# Patient Record
Sex: Female | Born: 1935 | Race: White | Hispanic: No | Smoking: Former smoker
Health system: Southern US, Community
[De-identification: ages and names within clinical notes are randomized; demographics above are authoritative.]

## PROBLEM LIST (undated history)

## (undated) DIAGNOSIS — I10 Essential (primary) hypertension: Secondary | ICD-10-CM

## (undated) DIAGNOSIS — H409 Unspecified glaucoma: Secondary | ICD-10-CM

## (undated) DIAGNOSIS — Z87891 Personal history of nicotine dependence: Secondary | ICD-10-CM

## (undated) DIAGNOSIS — Z1211 Encounter for screening for malignant neoplasm of colon: Secondary | ICD-10-CM

## (undated) DIAGNOSIS — E039 Hypothyroidism, unspecified: Secondary | ICD-10-CM

## (undated) DIAGNOSIS — E785 Hyperlipidemia, unspecified: Secondary | ICD-10-CM

## (undated) DIAGNOSIS — I509 Heart failure, unspecified: Secondary | ICD-10-CM

## (undated) DIAGNOSIS — G309 Alzheimer's disease, unspecified: Secondary | ICD-10-CM

## (undated) DIAGNOSIS — F329 Major depressive disorder, single episode, unspecified: Secondary | ICD-10-CM

## (undated) DIAGNOSIS — Z1239 Encounter for other screening for malignant neoplasm of breast: Secondary | ICD-10-CM

## (undated) DIAGNOSIS — I4891 Unspecified atrial fibrillation: Secondary | ICD-10-CM

## (undated) DIAGNOSIS — I6529 Occlusion and stenosis of unspecified carotid artery: Secondary | ICD-10-CM

## (undated) DIAGNOSIS — F32A Depression, unspecified: Secondary | ICD-10-CM

## (undated) DIAGNOSIS — F0280 Dementia in other diseases classified elsewhere without behavioral disturbance: Secondary | ICD-10-CM

## (undated) HISTORY — DX: Personal history of nicotine dependence: Z87.891

## (undated) HISTORY — DX: Unspecified glaucoma: H40.9

## (undated) HISTORY — DX: Hyperlipidemia, unspecified: E78.5

## (undated) HISTORY — PX: BACK SURGERY: SHX140

## (undated) HISTORY — DX: Essential (primary) hypertension: I10

## (undated) HISTORY — DX: Encounter for other screening for malignant neoplasm of breast: Z12.39

## (undated) HISTORY — DX: Encounter for screening for malignant neoplasm of colon: Z12.11

## (undated) HISTORY — DX: Occlusion and stenosis of unspecified carotid artery: I65.29

---
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
## 2004-12-09 HISTORY — PX: COLONOSCOPY: SHX174

## 2004-12-20 ENCOUNTER — Ambulatory Visit: Payer: Self-pay | Admitting: Internal Medicine

## 2006-02-12 DIAGNOSIS — I10 Essential (primary) hypertension: Secondary | ICD-10-CM | POA: Insufficient documentation

## 2006-02-12 DIAGNOSIS — M159 Polyosteoarthritis, unspecified: Secondary | ICD-10-CM | POA: Insufficient documentation

## 2006-02-12 DIAGNOSIS — K5730 Diverticulosis of large intestine without perforation or abscess without bleeding: Secondary | ICD-10-CM | POA: Insufficient documentation

## 2006-02-12 DIAGNOSIS — J309 Allergic rhinitis, unspecified: Secondary | ICD-10-CM | POA: Insufficient documentation

## 2006-02-12 DIAGNOSIS — H4010X0 Unspecified open-angle glaucoma, stage unspecified: Secondary | ICD-10-CM | POA: Insufficient documentation

## 2006-02-28 ENCOUNTER — Ambulatory Visit: Payer: Self-pay | Admitting: Family Medicine

## 2006-09-08 DIAGNOSIS — E039 Hypothyroidism, unspecified: Secondary | ICD-10-CM | POA: Insufficient documentation

## 2007-04-15 ENCOUNTER — Ambulatory Visit: Payer: Self-pay | Admitting: Family Medicine

## 2007-05-14 DIAGNOSIS — K8020 Calculus of gallbladder without cholecystitis without obstruction: Secondary | ICD-10-CM

## 2008-04-19 ENCOUNTER — Ambulatory Visit: Payer: Self-pay | Admitting: Family Medicine

## 2008-04-20 ENCOUNTER — Ambulatory Visit: Payer: Self-pay | Admitting: Family Medicine

## 2008-07-21 ENCOUNTER — Ambulatory Visit: Payer: Self-pay | Admitting: Family Medicine

## 2008-10-19 ENCOUNTER — Ambulatory Visit: Payer: Self-pay | Admitting: Family Medicine

## 2008-12-09 HISTORY — PX: CAROTID ENDARTERECTOMY: SUR193

## 2008-12-27 ENCOUNTER — Ambulatory Visit: Payer: Self-pay | Admitting: Family Medicine

## 2009-01-18 ENCOUNTER — Ambulatory Visit: Payer: Self-pay | Admitting: General Surgery

## 2009-02-08 ENCOUNTER — Ambulatory Visit: Payer: Self-pay | Admitting: General Surgery

## 2009-02-15 ENCOUNTER — Inpatient Hospital Stay: Payer: Self-pay | Admitting: General Surgery

## 2009-10-23 ENCOUNTER — Ambulatory Visit: Payer: Self-pay | Admitting: Family Medicine

## 2009-11-01 ENCOUNTER — Ambulatory Visit: Payer: Self-pay | Admitting: Ophthalmology

## 2009-11-15 ENCOUNTER — Ambulatory Visit: Payer: Self-pay | Admitting: Ophthalmology

## 2010-10-30 ENCOUNTER — Ambulatory Visit: Payer: Self-pay | Admitting: Family Medicine

## 2010-11-02 ENCOUNTER — Other Ambulatory Visit: Payer: Self-pay | Admitting: Family Medicine

## 2011-07-31 ENCOUNTER — Ambulatory Visit: Payer: Self-pay | Admitting: Ophthalmology

## 2013-01-12 ENCOUNTER — Ambulatory Visit: Payer: Self-pay | Admitting: Neurology

## 2013-06-07 ENCOUNTER — Encounter: Payer: Self-pay | Admitting: *Deleted

## 2013-06-07 DIAGNOSIS — I6529 Occlusion and stenosis of unspecified carotid artery: Secondary | ICD-10-CM | POA: Insufficient documentation

## 2013-07-22 ENCOUNTER — Ambulatory Visit: Payer: Self-pay | Admitting: Family Medicine

## 2013-07-29 ENCOUNTER — Ambulatory Visit: Payer: Self-pay | Admitting: Family Medicine

## 2013-11-25 ENCOUNTER — Ambulatory Visit: Payer: Self-pay | Admitting: Family Medicine

## 2013-12-15 ENCOUNTER — Ambulatory Visit: Payer: Self-pay | Admitting: General Surgery

## 2013-12-22 ENCOUNTER — Emergency Department: Payer: Self-pay | Admitting: Emergency Medicine

## 2013-12-22 LAB — BASIC METABOLIC PANEL
ANION GAP: 4 — AB (ref 7–16)
BUN: 15 mg/dL (ref 7–18)
CREATININE: 0.85 mg/dL (ref 0.60–1.30)
Calcium, Total: 8.3 mg/dL — ABNORMAL LOW (ref 8.5–10.1)
Chloride: 100 mmol/L (ref 98–107)
Co2: 28 mmol/L (ref 21–32)
Glucose: 79 mg/dL (ref 65–99)
Osmolality: 264 (ref 275–301)
Potassium: 3.8 mmol/L (ref 3.5–5.1)
Sodium: 132 mmol/L — ABNORMAL LOW (ref 136–145)

## 2013-12-22 LAB — URINALYSIS, COMPLETE
Bilirubin,UR: NEGATIVE
Glucose,UR: NEGATIVE mg/dL (ref 0–75)
NITRITE: NEGATIVE
Ph: 5 (ref 4.5–8.0)
Protein: NEGATIVE
RBC,UR: 3 /HPF (ref 0–5)
SPECIFIC GRAVITY: 1.019 (ref 1.003–1.030)
WBC UR: 29 /HPF (ref 0–5)

## 2013-12-22 LAB — CBC WITH DIFFERENTIAL/PLATELET
Basophil #: 0.1 10*3/uL (ref 0.0–0.1)
Basophil %: 1.4 %
EOS ABS: 0.2 10*3/uL (ref 0.0–0.7)
Eosinophil %: 2.7 %
HCT: 40 % (ref 35.0–47.0)
HGB: 13.7 g/dL (ref 12.0–16.0)
LYMPHS ABS: 2.4 10*3/uL (ref 1.0–3.6)
Lymphocyte %: 34.5 %
MCH: 33.7 pg (ref 26.0–34.0)
MCHC: 34.2 g/dL (ref 32.0–36.0)
MCV: 98 fL (ref 80–100)
MONO ABS: 0.5 x10 3/mm (ref 0.2–0.9)
MONOS PCT: 6.9 %
Neutrophil #: 3.7 10*3/uL (ref 1.4–6.5)
Neutrophil %: 54.5 %
Platelet: 226 10*3/uL (ref 150–440)
RBC: 4.06 10*6/uL (ref 3.80–5.20)
RDW: 13.5 % (ref 11.5–14.5)
WBC: 6.9 10*3/uL (ref 3.6–11.0)

## 2013-12-22 LAB — TROPONIN I

## 2013-12-26 LAB — URINE CULTURE

## 2014-01-12 ENCOUNTER — Encounter: Payer: Self-pay | Admitting: *Deleted

## 2014-04-03 ENCOUNTER — Emergency Department: Payer: Self-pay

## 2014-04-03 LAB — CBC WITH DIFFERENTIAL/PLATELET
BASOS ABS: 0.1 10*3/uL (ref 0.0–0.1)
BASOS PCT: 1 %
Eosinophil #: 0.2 10*3/uL (ref 0.0–0.7)
Eosinophil %: 1.9 %
HCT: 36.5 % (ref 35.0–47.0)
HGB: 12.1 g/dL (ref 12.0–16.0)
LYMPHS ABS: 2.1 10*3/uL (ref 1.0–3.6)
LYMPHS PCT: 25.2 %
MCH: 34.4 pg — ABNORMAL HIGH (ref 26.0–34.0)
MCHC: 33.2 g/dL (ref 32.0–36.0)
MCV: 104 fL — AB (ref 80–100)
MONO ABS: 0.5 x10 3/mm (ref 0.2–0.9)
MONOS PCT: 6.5 %
Neutrophil #: 5.5 10*3/uL (ref 1.4–6.5)
Neutrophil %: 65.4 %
Platelet: 256 10*3/uL (ref 150–440)
RBC: 3.53 10*6/uL — ABNORMAL LOW (ref 3.80–5.20)
RDW: 15.6 % — AB (ref 11.5–14.5)
WBC: 8.5 10*3/uL (ref 3.6–11.0)

## 2014-04-03 LAB — BASIC METABOLIC PANEL
Anion Gap: 5 — ABNORMAL LOW (ref 7–16)
BUN: 15 mg/dL (ref 7–18)
CALCIUM: 8.5 mg/dL (ref 8.5–10.1)
CHLORIDE: 101 mmol/L (ref 98–107)
CO2: 30 mmol/L (ref 21–32)
Creatinine: 0.86 mg/dL (ref 0.60–1.30)
EGFR (Non-African Amer.): >60
Glucose: 95 mg/dL (ref 65–99)
Osmolality: 273 (ref 275–301)
Potassium: 4 mmol/L (ref 3.5–5.1)
Sodium: 136 mmol/L (ref 136–145)

## 2014-04-29 ENCOUNTER — Emergency Department: Payer: Self-pay | Admitting: Emergency Medicine

## 2014-04-29 LAB — BASIC METABOLIC PANEL
ANION GAP: 6 — AB (ref 7–16)
BUN: 15 mg/dL (ref 7–18)
CHLORIDE: 105 mmol/L (ref 98–107)
Calcium, Total: 8.9 mg/dL (ref 8.5–10.1)
Co2: 25 mmol/L (ref 21–32)
Creatinine: 0.86 mg/dL (ref 0.60–1.30)
EGFR (Non-African Amer.): >60
Glucose: 119 mg/dL — ABNORMAL HIGH (ref 65–99)
Osmolality: 274 (ref 275–301)
Potassium: 4 mmol/L (ref 3.5–5.1)
Sodium: 136 mmol/L (ref 136–145)

## 2014-04-29 LAB — CBC WITH DIFFERENTIAL/PLATELET
Basophil #: 0.1 10*3/uL (ref 0.0–0.1)
Basophil %: 1.2 %
Eosinophil #: 0.2 10*3/uL (ref 0.0–0.7)
Eosinophil %: 3.1 %
HCT: 39.4 % (ref 35.0–47.0)
HGB: 12.6 g/dL (ref 12.0–16.0)
LYMPHS ABS: 2.1 10*3/uL (ref 1.0–3.6)
LYMPHS PCT: 33 %
MCH: 33.4 pg (ref 26.0–34.0)
MCHC: 32.1 g/dL (ref 32.0–36.0)
MCV: 104 fL — ABNORMAL HIGH (ref 80–100)
Monocyte #: 0.5 x10 3/mm (ref 0.2–0.9)
Monocyte %: 7.6 %
NEUTROS ABS: 3.5 10*3/uL (ref 1.4–6.5)
NEUTROS PCT: 55.1 %
Platelet: 237 10*3/uL (ref 150–440)
RBC: 3.77 10*6/uL — AB (ref 3.80–5.20)
RDW: 14.3 % (ref 11.5–14.5)
WBC: 6.3 10*3/uL (ref 3.6–11.0)

## 2014-05-06 ENCOUNTER — Emergency Department: Payer: Self-pay | Admitting: Emergency Medicine

## 2015-07-19 ENCOUNTER — Other Ambulatory Visit
Admission: RE | Admit: 2015-07-19 | Discharge: 2015-07-19 | Disposition: A | Discharge status: Home / Self Care | Payer: Medicare Other | Source: Ambulatory Visit | Attending: Nurse Practitioner | Admitting: Nurse Practitioner

## 2015-07-19 DIAGNOSIS — R410 Disorientation, unspecified: Secondary | ICD-10-CM | POA: Diagnosis present

## 2015-07-19 LAB — URINALYSIS COMPLETE WITH MICROSCOPIC (ARMC ONLY)
Glucose, UA: NEGATIVE mg/dL
NITRITE: NEGATIVE
PROTEIN: 30 mg/dL — AB
Specific Gravity, Urine: 1.02 (ref 1.005–1.030)
pH: 6 (ref 5.0–8.0)

## 2015-08-10 ENCOUNTER — Other Ambulatory Visit
Admission: RE | Admit: 2015-08-10 | Discharge: 2015-08-10 | Disposition: A | Discharge status: Home / Self Care | Payer: Medicare Other | Source: Ambulatory Visit | Attending: Nurse Practitioner | Admitting: Nurse Practitioner

## 2015-08-10 DIAGNOSIS — R410 Disorientation, unspecified: Secondary | ICD-10-CM | POA: Insufficient documentation

## 2015-08-10 LAB — URINALYSIS COMPLETE WITH MICROSCOPIC (ARMC ONLY)
Bilirubin Urine: NEGATIVE
Glucose, UA: NEGATIVE mg/dL
Nitrite: POSITIVE — AB
Protein, ur: 100 mg/dL — AB
Specific Gravity, Urine: 1.025 (ref 1.005–1.030)
pH: 5.5 (ref 5.0–8.0)

## 2015-08-13 LAB — URINE CULTURE: Culture: >=100000

## 2015-09-13 ENCOUNTER — Other Ambulatory Visit
Admission: RE | Admit: 2015-09-13 | Discharge: 2015-09-13 | Disposition: A | Discharge status: Home / Self Care | Payer: Medicare Other | Source: Ambulatory Visit | Attending: Nurse Practitioner | Admitting: Nurse Practitioner

## 2015-09-13 DIAGNOSIS — R410 Disorientation, unspecified: Secondary | ICD-10-CM | POA: Insufficient documentation

## 2015-09-13 LAB — URINALYSIS COMPLETE WITH MICROSCOPIC (ARMC ONLY)
Bilirubin Urine: NEGATIVE
Glucose, UA: NEGATIVE mg/dL
Nitrite: POSITIVE — AB
PH: 5.5 (ref 5.0–8.0)
PROTEIN: NEGATIVE mg/dL
Specific Gravity, Urine: 1.02 (ref 1.005–1.030)

## 2015-09-16 LAB — URINE CULTURE

## 2015-10-14 ENCOUNTER — Other Ambulatory Visit
Admission: RE | Admit: 2015-10-14 | Discharge: 2015-10-14 | Disposition: A | Discharge status: Home / Self Care | Payer: Medicare Other | Source: Ambulatory Visit | Attending: Nurse Practitioner | Admitting: Nurse Practitioner

## 2015-10-14 DIAGNOSIS — N390 Urinary tract infection, site not specified: Secondary | ICD-10-CM | POA: Diagnosis present

## 2015-10-14 LAB — URINALYSIS COMPLETE WITH MICROSCOPIC (ARMC ONLY)
Bilirubin Urine: NEGATIVE
GLUCOSE, UA: NEGATIVE mg/dL
Ketones, ur: NEGATIVE mg/dL
NITRITE: POSITIVE — AB
PROTEIN: NEGATIVE mg/dL
SPECIFIC GRAVITY, URINE: 1.015 (ref 1.005–1.030)
pH: 5 (ref 5.0–8.0)

## 2015-10-17 LAB — URINE CULTURE: Culture: >=100000

## 2015-11-25 ENCOUNTER — Ambulatory Visit
Admission: EM | Admit: 2015-11-25 | Discharge: 2015-11-25 | Disposition: A | Discharge status: Home / Self Care | Payer: Medicare Other | Attending: Internal Medicine | Admitting: Internal Medicine

## 2015-11-25 DIAGNOSIS — R21 Rash and other nonspecific skin eruption: Secondary | ICD-10-CM | POA: Diagnosis not present

## 2015-11-25 HISTORY — DX: Major depressive disorder, single episode, unspecified: F32.9

## 2015-11-25 HISTORY — DX: Alzheimer's disease, unspecified: G30.9

## 2015-11-25 HISTORY — DX: Depression, unspecified: F32.A

## 2015-11-25 HISTORY — DX: Hypothyroidism, unspecified: E03.9

## 2015-11-25 HISTORY — DX: Dementia in other diseases classified elsewhere, unspecified severity, without behavioral disturbance, psychotic disturbance, mood disturbance, and anxiety: F02.80

## 2015-11-25 HISTORY — DX: Unspecified atrial fibrillation: I48.91

## 2015-11-25 LAB — URINALYSIS COMPLETE WITH MICROSCOPIC (ARMC ONLY)
Bilirubin Urine: NEGATIVE
Glucose, UA: NEGATIVE mg/dL
Leukocytes, UA: NEGATIVE
Nitrite: POSITIVE — AB
SPECIFIC GRAVITY, URINE: 1.025 (ref 1.005–1.030)
pH: 5 (ref 5.0–8.0)

## 2015-11-25 NOTE — Discharge Instructions (Signed)
Urine culture pending; risk of treatment of a UTI would have to be weighed against the benefit. Perineal dermatitis: chapped skin would benefit from heavy application of barrier cream (already on med list) at least daily. No wipes for cleaning unless absolutely necessary after a bowel movement.  Soaps/wipes are often too irritating for bottoms. Check on med list; previous med lists show a thyroid product which is not currently on med list. Recheck as needed.

## 2015-11-25 NOTE — ED Provider Notes (Signed)
CSN: [id number]     Arrival date & time 11/25/15  1536 History   First MD Initiated Contact with Patient 11/25/15 1606     Chief Complaint  Patient presents with  . Urinary Frequency    Daughter reports incontinence and thinks she has a UTI. Has been falling more than usual. Daughter reports her urine smells strong. Hx of frequent UTIs and lives a [facility] (lives in a locked unit).    HPI Patient is a 81 year old lady with dementia and frequent UTIs. Often, culture evidence of UTI is not associated with severe symptoms, and PCP is beginning to express concern about automatic treatment, due to risk of multiply resistant organisms. Patient's daughter has observed some odor to the urine, and some evidence that her mother is not feeling well, and would like an assessment. No fever. Some tendency to fall, although none reported today. Appetite is fine.  Past Medical History  Diagnosis Date  . Glaucoma   . Hypertension   . Personal history of tobacco use, presenting hazards to health   . Carotid stenosis   . Breast screening, unspecified   . Special screening for malignant neoplasms, colon   . Hyperlipidemia   . Alzheimer disease   . Hypothyroidism   . Depression   . Atrial fibrillation [facility](HCC)    Past Surgical History  Procedure Laterality Date  . Colonoscopy  2006    [place]  . Carotid endarterectomy Left 2010  . Appendectomy  1940  . Back surgery  [id number]  . Tonsillectomy  1940   History reviewed. No pertinent family history. Social History  Substance Use Topics  . Smoking status: Former Smoker -- 1.00 packs/day for 20 years    Types: Cigarettes  . Smokeless tobacco: None  . Alcohol Use: 3.5 oz/week    7 Standard drinks or equivalent per week     Comment: vodka, 1 drink daily    Review of Systems  All other systems reviewed and are negative.   Allergies  Penicillins  Home Medications   Prior to Admission medications   Medication Sig Start Date End Date Taking?  Authorizing Provider  acetaminophen (TYLENOL) 500 MG tablet Take 1,000 mg by mouth every 6 (six) hours as needed.   Yes Historical Provider, MD  aspirin 325 MG tablet Take 325 mg by mouth daily.   Yes Historical Provider, MD  escitalopram (LEXAPRO) 20 MG tablet Take 20 mg by mouth daily.   Yes Historical Provider, MD  metoprolol tartrate (LOPRESSOR) 25 MG tablet Take 25 mg by mouth 2 (two) times daily.   Yes Historical Provider, MD  pravastatin (PRAVACHOL) 20 MG tablet Take 20 mg by mouth daily.   Yes Historical Provider, MD  pseudoephedrine-guaifenesin (MUCINEX D) 60-600 MG 12 hr tablet Take 1 tablet by mouth every 12 (twelve) hours.   Yes Historical Provider, MD  Skin Protectants, Misc. (DIMETHICONE-ZINC OXIDE) cream Apply topically 2 (two) times daily as needed (to peri area).   Yes Historical Provider, MD    BP 117/67 mmHg  Pulse 74  Temp(Src) 97.5 F (36.4 C) (Oral)  Resp 16  Ht 5\' 1"  (1.549 m)  Wt 141 lb 8 oz (64.184 kg)  BMI 26.75 kg/m2  SpO2 96%  orthostatic vital signs obtained by nursing staff are unremarkable; recorded elsewhere in the chart  Physical Exam  Constitutional: No distress.  Alert, nicely groomed Sitting on her walker  HENT:  Head: Atraumatic.  Eyes:  Conjugate gaze, no eye redness/drainage  Neck: Neck supple.  Cardiovascular: Normal rate.   Irregularly irregular rhythm  Pulmonary/Chest: No respiratory distress.  Lungs clear, symmetric breath sounds Occasional faint expiratory wheeze  Abdominal: Soft. She exhibits no distension. There is no tenderness. There is no guarding.  Musculoskeletal: Normal range of motion.  Trace bilateral pitting edema, not tense  Neurological: She is alert.  Pleasant, conversational  Skin: Skin is warm and dry.  No cyanosis  Nursing note and vitals reviewed.  Nurse reported heavily excoriated perineal skin was observed during the in and out catheterization for urine sample.  ED Course  Procedures (including critical  care time)  Labs Review  Results for orders placed or performed during the hospital encounter of 11/25/15  Urinalysis complete, with microscopic  Result Value Ref Range   Color, Urine YELLOW YELLOW   APPearance CLEAR CLEAR   Glucose, UA NEGATIVE NEGATIVE mg/dL   Bilirubin Urine NEGATIVE NEGATIVE   Ketones, ur TRACE (A) NEGATIVE mg/dL   Specific Gravity, Urine 1.025 1.005 - 1.030   Hgb urine dipstick 2+ (A) NEGATIVE   pH 5.0 5.0 - 8.0   Protein, ur TRACE (A) NEGATIVE mg/dL   Nitrite POSITIVE (A) NEGATIVE   Leukocytes, UA NEGATIVE NEGATIVE   RBC / HPF 0-5 0 - 5 RBC/hpf   WBC, UA 6-30 0 - 5 WBC/hpf   Bacteria, UA MANY (A) NONE SEEN   Squamous Epithelial / LPF 0-5 (A) NONE SEEN   Urine culture pending  MDM   1. Perineal rash in female    Thick layer of barrier ointment at least daily (already prescribed) would be helpful. Would avoid the use of wipes, except after bowel movement, and then use wipes or cleansers only sparingly on inflamed skin.  Treatment of any positive urine culture results would need to be considered carefully, risks versus benefits. Risks would be acquisition of multiply resistant organism, benefit would be more likely if the patient were more severely ill/symptomatic.    Eustace Moore, MD 11/25/15 [phone]

## 2015-11-27 LAB — URINE CULTURE
Culture: >=100000
SPECIAL REQUESTS: NORMAL

## 2015-11-28 ENCOUNTER — Other Ambulatory Visit
Admission: RE | Admit: 2015-11-28 | Discharge: 2015-11-28 | Disposition: A | Discharge status: Home / Self Care | Payer: Medicare Other | Source: Ambulatory Visit | Attending: Nurse Practitioner | Admitting: Nurse Practitioner

## 2015-11-28 DIAGNOSIS — D649 Anemia, unspecified: Secondary | ICD-10-CM | POA: Diagnosis not present

## 2015-11-28 DIAGNOSIS — E039 Hypothyroidism, unspecified: Secondary | ICD-10-CM | POA: Diagnosis present

## 2015-11-28 DIAGNOSIS — I10 Essential (primary) hypertension: Secondary | ICD-10-CM | POA: Diagnosis not present

## 2015-11-28 LAB — COMPREHENSIVE METABOLIC PANEL
ALBUMIN: 4.1 g/dL (ref 3.5–5.0)
ALT: 11 U/L — ABNORMAL LOW (ref 14–54)
ANION GAP: 10 (ref 5–15)
AST: 18 U/L (ref 15–41)
Alkaline Phosphatase: 90 U/L (ref 38–126)
BILIRUBIN TOTAL: 0.4 mg/dL (ref 0.3–1.2)
BUN: 24 mg/dL — ABNORMAL HIGH (ref 6–20)
CHLORIDE: 98 mmol/L — AB (ref 101–111)
CO2: 27 mmol/L (ref 22–32)
Calcium: 9.1 mg/dL (ref 8.9–10.3)
Creatinine, Ser: 0.92 mg/dL (ref 0.44–1.00)
GFR calc Af Amer: >60 mL/min (ref >60)
GFR calc non Af Amer: 56 mL/min — ABNORMAL LOW (ref >60)
GLUCOSE: 92 mg/dL (ref 65–99)
POTASSIUM: 4.5 mmol/L (ref 3.5–5.1)
SODIUM: 135 mmol/L (ref 135–145)
TOTAL PROTEIN: 7.5 g/dL (ref 6.5–8.1)

## 2015-11-28 LAB — CBC
HCT: 35.9 % (ref 35.0–47.0)
HEMOGLOBIN: 11.6 g/dL — AB (ref 12.0–16.0)
MCH: 31.1 pg (ref 26.0–34.0)
MCHC: 32.4 g/dL (ref 32.0–36.0)
MCV: 95.8 fL (ref 80.0–100.0)
PLATELETS: 243 10*3/uL (ref 150–440)
RBC: 3.75 MIL/uL — AB (ref 3.80–5.20)
RDW: 15.7 % — AB (ref 11.5–14.5)
WBC: 5.5 10*3/uL (ref 3.6–11.0)

## 2015-11-29 ENCOUNTER — Telehealth: Payer: Self-pay | Admitting: Internal Medicine

## 2015-11-29 LAB — TSH: TSH: 10.989 u[IU]/mL — ABNORMAL HIGH (ref 0.350–4.500)

## 2015-11-29 NOTE — ED Notes (Signed)
Called patient's daughter, Chancy HurterJanice Murphy, last evening to let her know about positive culture result.  Daughter said that her mom is acting like she feels a lot better, with use of barrier cream to perineal area, and seems improved.  We discussed briefly again the need to consider the clinical picture before treating the culture result.  Will [facility]; clinical staff [facility].  Eustace MooreLaura W Allora Bains, MD 11/29/15 1021

## 2015-12-18 ENCOUNTER — Emergency Department
Admission: EM | Admit: 2015-12-18 | Discharge: 2015-12-19 | Disposition: A | Discharge status: Home / Self Care | Payer: Medicare Other | Attending: Emergency Medicine | Admitting: Emergency Medicine

## 2015-12-18 ENCOUNTER — Emergency Department: Payer: Medicare Other

## 2015-12-18 ENCOUNTER — Encounter: Payer: Self-pay | Admitting: Emergency Medicine

## 2015-12-18 DIAGNOSIS — I10 Essential (primary) hypertension: Secondary | ICD-10-CM | POA: Insufficient documentation

## 2015-12-18 DIAGNOSIS — Z7982 Long term (current) use of aspirin: Secondary | ICD-10-CM | POA: Diagnosis not present

## 2015-12-18 DIAGNOSIS — Z87891 Personal history of nicotine dependence: Secondary | ICD-10-CM | POA: Insufficient documentation

## 2015-12-18 DIAGNOSIS — Y9389 Activity, other specified: Secondary | ICD-10-CM | POA: Insufficient documentation

## 2015-12-18 DIAGNOSIS — W01198A Fall on same level from slipping, tripping and stumbling with subsequent striking against other object, initial encounter: Secondary | ICD-10-CM | POA: Insufficient documentation

## 2015-12-18 DIAGNOSIS — Y998 Other external cause status: Secondary | ICD-10-CM | POA: Insufficient documentation

## 2015-12-18 DIAGNOSIS — Z880 Allergy status to penicillin: Secondary | ICD-10-CM | POA: Diagnosis not present

## 2015-12-18 DIAGNOSIS — S0083XA Contusion of other part of head, initial encounter: Secondary | ICD-10-CM | POA: Diagnosis not present

## 2015-12-18 DIAGNOSIS — G309 Alzheimer's disease, unspecified: Secondary | ICD-10-CM | POA: Diagnosis not present

## 2015-12-18 DIAGNOSIS — Y92128 Other place in nursing home as the place of occurrence of the external cause: Secondary | ICD-10-CM | POA: Diagnosis not present

## 2015-12-18 DIAGNOSIS — F0280 Dementia in other diseases classified elsewhere without behavioral disturbance: Secondary | ICD-10-CM | POA: Diagnosis not present

## 2015-12-18 DIAGNOSIS — S0990XA Unspecified injury of head, initial encounter: Secondary | ICD-10-CM | POA: Diagnosis present

## 2015-12-18 DIAGNOSIS — Z79899 Other long term (current) drug therapy: Secondary | ICD-10-CM | POA: Diagnosis not present

## 2015-12-18 LAB — CBC WITH DIFFERENTIAL/PLATELET
BASOS ABS: 0.1 10*3/uL (ref 0–0.1)
BASOS PCT: 1 %
EOS PCT: 2 %
Eosinophils Absolute: 0.2 10*3/uL (ref 0–0.7)
HCT: 34.9 % — ABNORMAL LOW (ref 35.0–47.0)
Hemoglobin: 11.4 g/dL — ABNORMAL LOW (ref 12.0–16.0)
Lymphocytes Relative: 15 %
Lymphs Abs: 1.4 10*3/uL (ref 1.0–3.6)
MCH: 30.7 pg (ref 26.0–34.0)
MCHC: 32.6 g/dL (ref 32.0–36.0)
MCV: 94 fL (ref 80.0–100.0)
MONO ABS: 0.8 10*3/uL (ref 0.2–0.9)
Monocytes Relative: 8 %
Neutro Abs: 7 10*3/uL — ABNORMAL HIGH (ref 1.4–6.5)
Neutrophils Relative %: 74 %
PLATELETS: 248 10*3/uL (ref 150–440)
RBC: 3.71 MIL/uL — ABNORMAL LOW (ref 3.80–5.20)
RDW: 15.2 % — AB (ref 11.5–14.5)
WBC: 9.5 10*3/uL (ref 3.6–11.0)

## 2015-12-18 LAB — COMPREHENSIVE METABOLIC PANEL
ALBUMIN: 3.7 g/dL (ref 3.5–5.0)
ALT: 10 U/L — ABNORMAL LOW (ref 14–54)
AST: 18 U/L (ref 15–41)
Alkaline Phosphatase: 91 U/L (ref 38–126)
Anion gap: 8 (ref 5–15)
BUN: 23 mg/dL — AB (ref 6–20)
CHLORIDE: 105 mmol/L (ref 101–111)
CO2: 24 mmol/L (ref 22–32)
Calcium: 8.5 mg/dL — ABNORMAL LOW (ref 8.9–10.3)
Creatinine, Ser: 0.86 mg/dL (ref 0.44–1.00)
GFR calc Af Amer: >60 mL/min (ref >60)
GFR calc non Af Amer: >60 mL/min (ref >60)
GLUCOSE: 110 mg/dL — AB (ref 65–99)
POTASSIUM: 3.8 mmol/L (ref 3.5–5.1)
SODIUM: 137 mmol/L (ref 135–145)
Total Bilirubin: 0.5 mg/dL (ref 0.3–1.2)
Total Protein: 6.9 g/dL (ref 6.5–8.1)

## 2015-12-18 LAB — PROTIME-INR
INR: 1.09
PROTHROMBIN TIME: 14.3 s (ref 11.4–15.0)

## 2015-12-18 NOTE — ED Notes (Signed)
Pt dressed in own clothing; gold tone chain holding gold tone band replaced on pt's neck; pt with gold tone ring with black stone remains on pt's right 4th finger; pt denies c/o and voices good understanding of plan of care

## 2015-12-18 NOTE — ED Notes (Signed)
Patient transported to CT 

## 2015-12-18 NOTE — ED Provider Notes (Signed)
Time Seen: Approximately 2015  I have reviewed the triage notes  Chief Complaint: Fall   History of Present Illness: Paula Summers is a 82 y.o. female who presents after a unwitnessed fall. The staff denied any loss of consciousness. She appears to have struck the left side of her head with a newly found left forehead hematoma. Patient has a history of Alzheimer's dementia and at this time denies any focal discomfort. History and review of systems mainly acquired from the nursing facility.   Past Medical History  Diagnosis Date  . Glaucoma   . Hypertension   . Personal history of tobacco use, presenting hazards to health   . Carotid stenosis   . Breast screening, unspecified   . Special screening for malignant neoplasms, colon   . Hyperlipidemia   . Alzheimer disease   . Hypothyroidism   . Depression   . Atrial fibrillation [facility])     Patient Active Problem List   Diagnosis Date Noted  . Carotid stenosis     Past Surgical History  Procedure Laterality Date  . Colonoscopy  2006    [place]  . Carotid endarterectomy Left 2010  . Appendectomy  1940  . Back surgery  [id number]  . Tonsillectomy  1940    Past Surgical History  Procedure Laterality Date  . Colonoscopy  2006    [place]  . Carotid endarterectomy Left 2010  . Appendectomy  1940  . Back surgery  [id number]  . Tonsillectomy  1940    Current Outpatient Rx  Name  Route  Sig  Dispense  Refill  . acetaminophen (TYLENOL) 500 MG tablet   Oral   Take 1,000 mg by mouth every 6 (six) hours as needed.         Marland Kitchen aspirin 325 MG tablet   Oral   Take 325 mg by mouth daily.         Marland Kitchen escitalopram (LEXAPRO) 20 MG tablet   Oral   Take 20 mg by mouth daily.         . metoprolol tartrate (LOPRESSOR) 25 MG tablet   Oral   Take 25 mg by mouth 2 (two) times daily.         . pravastatin (PRAVACHOL) 20 MG tablet   Oral   Take 20 mg by mouth daily.         . pseudoephedrine-guaifenesin (MUCINEX D)  60-600 MG 12 hr tablet   Oral   Take 1 tablet by mouth every 12 (twelve) hours.         . Skin Protectants, Misc. (DIMETHICONE-ZINC OXIDE) cream   Topical   Apply topically 2 (two) times daily as needed (to peri area).           Allergies:  Penicillins  Family History: History reviewed. No pertinent family history.  Social History: Social History  Substance Use Topics  . Smoking status: Former Smoker -- 1.00 packs/day for 20 years    Types: Cigarettes  . Smokeless tobacco: None  . Alcohol Use: 3.5 oz/week    7 Standard drinks or equivalent per week     Comment: vodka, 1 drink daily     Review of Systems:   10 point review of systems was performed and was otherwise negative: Review of systems acquired from the medical record Constitutional: No fever Eyes: No visual disturbances ENT: No sore throat, ear pain Cardiac: No chest pain Respiratory: No shortness of breath, wheezing, or stridor Abdomen: No abdominal pain, no vomiting, No  diarrhea Endocrine: No weight loss, No night sweats Extremities: No peripheral edema, cyanosis Skin: No rashes, easy bruising Neurologic: No focal weakness, trouble with speech or swollowing Urologic: No dysuria, Hematuria, or urinary frequency Patient denies any focal pain.  Physical Exam:  ED Triage Vitals  Enc Vitals Group     BP 12/18/15 2013 140/76 mmHg     Pulse Rate 12/18/15 2014 93     Resp 12/18/15 2018 18     Temp 12/18/15 2018 97.8 F (36.6 C)     Temp Source 12/18/15 2018 Oral     SpO2 12/18/15 2014 96 %     Weight 12/18/15 2018 150 lb (68.04 kg)     Height 12/18/15 2018 5\' 5"  (1.651 m)     Head Cir --      Peak Flow --      Pain Score --      Pain Loc --      Pain Edu? --      Excl. in GC? --     General: Awake , Alert , and Oriented times 1. Patient's cooperative. Head: Normal cephalic , atraumatic patient has a left-sided frontal hematoma without any laceration. Eyes: Pupils equal , round, reactive to  light Nose/Throat: No nasal drainage, patent upper airway without erythema or exudate. No posterior crepitus or step-off noted with full range of motion Neck: Supple, Full range of motion, No anterior adenopathy or palpable thyroid masses Lungs: Clear to ascultation without wheezes , rhonchi, or rales Heart: Regular rate, regular rhythm without murmurs , gallops , or rubs Abdomen: Soft, non tender without rebound, guarding , or rigidity; bowel sounds positive and symmetric in all 4 quadrants. No organomegaly .        Extremities: 2 plus symmetric pulses. No edema, clubbing or cyanosis Neurologic: , Motor symmetric without deficits, sensory intact Skin: warm, dry, no rashes   Labs:   All laboratory work was reviewed including any pertinent negatives or positives listed below:  Labs Reviewed  CBC WITH DIFFERENTIAL/PLATELET - Abnormal; Notable for the following:    RBC 3.71 (*)    Hemoglobin 11.4 (*)    HCT 34.9 (*)    RDW 15.2 (*)    Neutro Abs 7.0 (*)    All other components within normal limits  COMPREHENSIVE METABOLIC PANEL - Abnormal; Notable for the following:    Glucose, Bld 110 (*)    BUN 23 (*)    Calcium 8.5 (*)    ALT 10 (*)    All other components within normal limits  PROTIME-INR   laboratory work was reviewed with no significant abnormalities  EKG:  ED ECG REPORT I, Jennye Moccasin, the attending physician, personally viewed and interpreted this ECG.  Date: 12/18/2015 EKG Time: 2222 Rate: 92 Rhythm: Atrial fibrillation QRS Axis: Right axis deviation Intervals: normal ST/T Wave abnormalities: Nonspecific ST-T wave abnormalities Conduction Disutrbances: none Narrative Interpretation: unremarkable Atrial fibrillation is old No new acute ischemic changes  Radiology:     FINDINGS: CT HEAD FINDINGS  Bilateral ethmoid, sphenoid and maxillary sinusitis is noted. Otherwise bony calvarium is unremarkable. Moderate diffuse cortical atrophy is noted. Moderate  chronic ischemic white matter disease is noted. Mild left frontal scalp hematoma is noted. No mass effect or midline shift is noted. Ventricular size is within normal limits. There is no evidence of mass lesion or acute infarction. Small subdural hematoma is seen in left temporal region.  CT CERVICAL SPINE FINDINGS  Reversal of normal lordosis of cervical spine  is noted. Severe degenerative disc disease is noted at all visualized levels of the cervical spine and upper thoracic spine. No fracture or significant spondylolisthesis is noted. Visualized lung apices are unremarkable.  IMPRESSION: Bilateral ethmoid, sphenoid and maxillary sinusitis.  Mild left frontal scalp hematoma is noted.  Moderate diffuse cortical atrophy and chronic ischemic white matter disease is noted.  Small left temporal subdural hematoma is noted.  Severe multilevel degenerative disc disease is noted in the cervical spine. No fracture or spondylolisthesis is noted.  Critical Value/emergent results were called by telephone at the time of interpretation on 12/18/2015 at 9:29 pm to Dr. Sharyn CreamerMARK QUALE , who verbally acknowledged these results.   Electronically Signed By: Lupita RaiderJames Green Jr, M.D. On: 12/18/2015 21:30          CT Cervical Spine Wo Contrast (Final result) Result time: 12/18/15 21:30:32   Final result by Rad Results In Interface (12/18/15 21:30:32)   Narrative:   CLINICAL DATA: Unwitnessed fall at nursing facility. No reported loss of consciousness. Left forehead hematoma. History of Alzheimer's disease.  EXAM: CT HEAD WITHOUT CONTRAST  CT CERVICAL SPINE WITHOUT CONTRAST  TECHNIQUE: Multidetector CT imaging of the head and cervical spine was performed following the standard protocol without intravenous contrast. Multiplanar CT image reconstructions of the cervical spine were also generated.  COMPARISON: CT scan of Apr 29, 2014.  FINDINGS: CT HEAD FINDINGS  Bilateral  ethmoid, sphenoid and maxillary sinusitis is noted. Otherwise bony calvarium is unremarkable. Moderate diffuse cortical atrophy is noted. Moderate chronic ischemic white matter disease is noted. Mild left frontal scalp hematoma is noted. No mass effect or midline shift is noted. Ventricular size is within normal limits. There is no evidence of mass lesion or acute infarction. Small subdural hematoma is seen in left temporal region.  CT CERVICAL SPINE FINDINGS  Reversal of normal lordosis of cervical spine is noted. Severe degenerative disc disease is noted at all visualized levels of the cervical spine and upper thoracic spine. No fracture or significant spondylolisthesis is noted. Visualized lung apices are unremarkable.  IMPRESSION: Bilateral ethmoid, sphenoid and maxillary sinusitis.  Mild left frontal scalp hematoma is noted.  Moderate diffuse cortical atrophy and chronic ischemic white matter disease is noted.  Small left temporal subdural hematoma is noted.  Severe multilevel degenerative disc disease is noted in the cervical spine. No fracture or spondylolisthesis is noted.  Critical Value/emergent results were called by telephone at the time of interpretation on 12/18/2015 at 9:29 pm to Dr. Sharyn CreamerMARK QUALE , who verbally acknowledged these results.        I personally reviewed the radiologic studies     ED Course: * Patient's stay here was uneventful and I reviewed the CT findings with [facility]. We cannot identify any significant lesion that would be clinically significant at this time. Patient's currently not on any anticoagulation medication that we are aware of. She is otherwise hemodynamically stable. We felt she could be managed on an outpatient basis.    Assessment:  Status post fall with acute closed head injury  Final Clinical Impression:   Final diagnoses:  Head injury, initial encounter     Plan:  Patient be  transferred back to the nursing facility with instructions on acute closed head injury          Jennye MoccasinBrian S Quigley, MD 12/18/15 2335

## 2015-12-18 NOTE — ED Notes (Signed)
ems pt from [facility] called out for unwitnessed fall pt denies LOC or hitting head but does not remember falling has a hematoma noted to left forehead pt has a history of alzheimers dementia. denies pain

## 2015-12-18 NOTE — Discharge Instructions (Signed)
Head Injury, Adult °You have a head injury. Headaches and throwing up (vomiting) are common after a head injury. It should be easy to wake up from sleeping. Sometimes you must stay in the hospital. Most problems happen within the first 24 hours. Side effects may occur up to 7-10 days after the injury.  °WHAT ARE THE TYPES OF HEAD INJURIES? °Head injuries can be as minor as a bump. Some head injuries can be more severe. More severe head injuries include: °· A jarring injury to the brain (concussion). °· A bruise of the brain (contusion). This mean there is bleeding in the brain that can cause swelling. °· A cracked skull (skull fracture). °· Bleeding in the brain that collects, clots, and forms a bump (hematoma). °WHEN SHOULD I GET HELP RIGHT AWAY?  °· You are confused or sleepy. °· You cannot be woken up. °· You feel sick to your stomach (nauseous) or keep throwing up (vomiting). °· Your dizziness or unsteadiness is getting worse. °· You have very bad, lasting headaches that are not helped by medicine. Take medicines only as told by your doctor. °· You cannot use your arms or legs like normal. °· You cannot walk. °· You notice changes in the black spots in the center of the colored part of your eye (pupil). °· You have clear or bloody fluid coming from your nose or ears. °· You have trouble seeing. °During the next 24 hours after the injury, you must stay with someone who can watch you. This person should get help right away (call 911 in the [place] if you start to shake and are not able to control it (have seizures), you pass out, or you are unable to wake up. °HOW CAN I PREVENT A HEAD INJURY IN THE FUTURE? °· Wear seat belts. °· Wear a helmet while bike riding and playing sports like football. °· Stay away from dangerous activities around the house. °WHEN CAN I RETURN TO NORMAL ACTIVITIES AND ATHLETICS? °See your doctor before doing these activities. You should not do normal activities or play contact sports until 1  week after the following symptoms have stopped: °· Headache that does not go away. °· Dizziness. °· Poor attention. °· Confusion. °· Memory problems. °· Sickness to your stomach or throwing up. °· Tiredness. °· Fussiness. °· Bothered by bright lights or loud noises. °· Anxiousness or depression. °· Restless sleep. °MAKE SURE YOU:  °· Understand these instructions. °· Will watch your condition. °· Will get help right away if you are not doing well or get worse. °  °This information is not intended to replace advice given to you by your health care provider. Make sure you discuss any questions you have with your health care provider. °  °Document Released: 11/07/2008 Document Revised: 12/16/2014 Document Reviewed: 08/02/2013 °Elsevier Interactive Patient Education ©2016 Elsevier Inc. ° °Please return immediately if condition worsens. Please contact her primary physician or the physician you were given for referral. If you have any specialist physicians involved in her treatment and plan please also contact them. Thank you for using [facility]. ° °

## 2015-12-18 NOTE — ED Notes (Signed)
Attempted to call pt's POA Chancy Hurter(Janice Murphy at [phone] and [phone])) with no answer; called secondary daughter Olegario MessierKathy [phone]) and informed of pt's ED admission, exam and findings; daughter voices good understanding and instructs this nurse to send pt back by EMS to nursing facility

## 2016-02-01 ENCOUNTER — Other Ambulatory Visit
Admission: RE | Admit: 2016-02-01 | Discharge: 2016-02-01 | Disposition: A | Discharge status: Home / Self Care | Payer: Medicare Other | Source: Ambulatory Visit | Attending: Nurse Practitioner | Admitting: Nurse Practitioner

## 2016-02-01 DIAGNOSIS — R946 Abnormal results of thyroid function studies: Secondary | ICD-10-CM | POA: Insufficient documentation

## 2016-02-01 LAB — TSH: TSH: 1.468 u[IU]/mL (ref 0.350–4.500)

## 2016-02-09 ENCOUNTER — Other Ambulatory Visit
Admission: RE | Admit: 2016-02-09 | Discharge: 2016-02-09 | Disposition: A | Discharge status: Home / Self Care | Payer: Medicare Other | Source: Skilled Nursing Facility | Attending: Nurse Practitioner | Admitting: Nurse Practitioner

## 2016-02-09 DIAGNOSIS — N390 Urinary tract infection, site not specified: Secondary | ICD-10-CM | POA: Insufficient documentation

## 2016-02-09 LAB — URINALYSIS COMPLETE WITH MICROSCOPIC (ARMC ONLY)
BILIRUBIN URINE: NEGATIVE
Glucose, UA: NEGATIVE mg/dL
LEUKOCYTES UA: NEGATIVE
Nitrite: POSITIVE — AB
PH: 5.5 (ref 5.0–8.0)
PROTEIN: 100 mg/dL — AB

## 2016-02-11 LAB — URINE CULTURE

## 2016-02-16 ENCOUNTER — Ambulatory Visit (INDEPENDENT_AMBULATORY_CARE_PROVIDER_SITE_OTHER): Payer: Medicare Other | Admitting: Family Medicine

## 2016-02-16 ENCOUNTER — Encounter: Payer: Self-pay | Admitting: Family Medicine

## 2016-02-16 ENCOUNTER — Other Ambulatory Visit: Payer: Self-pay

## 2016-02-16 VITALS — BP 100/62 | HR 80 | Resp 16 | Wt 135.0 lb

## 2016-02-16 DIAGNOSIS — R2681 Unsteadiness on feet: Secondary | ICD-10-CM | POA: Insufficient documentation

## 2016-02-16 DIAGNOSIS — I4891 Unspecified atrial fibrillation: Secondary | ICD-10-CM | POA: Insufficient documentation

## 2016-02-16 DIAGNOSIS — F32A Depression, unspecified: Secondary | ICD-10-CM | POA: Insufficient documentation

## 2016-02-16 DIAGNOSIS — E7800 Pure hypercholesterolemia, unspecified: Secondary | ICD-10-CM | POA: Insufficient documentation

## 2016-02-16 DIAGNOSIS — F0390 Unspecified dementia without behavioral disturbance: Secondary | ICD-10-CM | POA: Insufficient documentation

## 2016-02-16 DIAGNOSIS — G939 Disorder of brain, unspecified: Secondary | ICD-10-CM | POA: Insufficient documentation

## 2016-02-16 DIAGNOSIS — I6782 Cerebral ischemia: Secondary | ICD-10-CM | POA: Insufficient documentation

## 2016-02-16 DIAGNOSIS — F329 Major depressive disorder, single episode, unspecified: Secondary | ICD-10-CM | POA: Insufficient documentation

## 2016-02-16 DIAGNOSIS — N390 Urinary tract infection, site not specified: Secondary | ICD-10-CM

## 2016-02-16 DIAGNOSIS — R296 Repeated falls: Secondary | ICD-10-CM | POA: Insufficient documentation

## 2016-02-16 NOTE — Progress Notes (Signed)
Patient ID: Paula Summers, female   DOB: 06/08/1931, 82 y.o.   MRN: [id number]       Patient: Paula BertinJeanette W Fortunato Female    DOB: 06/08/1931   82 y.o.   MRN: [id number] Visit Date: 02/17/2016  Today's Provider: Lorie PhenixNancy Georgie Eduardo, MD   Chief Complaint  Patient presents with  . Urinary Tract Infection   Subjective:    HPI Urinary Tract Infection: Patient here with her daughter Chancy Hurter(Janice Murphy). Mrs. Eulah PontMurphy is concerned about pt having frequent UTI's. [facility]. Patient is currently taking Nitrofurantoin 100 mg BID for 7 days. Patient was started on nitro on 02/08/2016.     Allergies  Allergen Reactions  . Penicillins Hives   Previous Medications   ACETAMINOPHEN (TYLENOL) 500 MG TABLET    Take 1,000 mg by mouth every 6 (six) hours as needed.   ASPIRIN 325 MG TABLET    Take 325 mg by mouth daily.   AZELASTINE-FLUTICASONE 137-50 MCG/ACT SUSP    Place into the nose.   ESCITALOPRAM (LEXAPRO) 20 MG TABLET    Take 20 mg by mouth daily.   LEVOTHYROXINE (SYNTHROID, LEVOTHROID) 75 MCG TABLET    Take by mouth.   METOPROLOL SUCCINATE (TOPROL-XL) 25 MG 24 HR TABLET    Take by mouth.   NITROFURANTOIN (MACRODANTIN) 100 MG CAPSULE    Take 100 mg by mouth 2 (two) times daily.   PRAVASTATIN (PRAVACHOL) 20 MG TABLET    Take 20 mg by mouth daily.   PSEUDOEPHEDRINE-GUAIFENESIN (MUCINEX D) 60-600 MG 12 HR TABLET    Take 1 tablet by mouth every 12 (twelve) hours.   SKIN PROTECTANTS, MISC. (DIMETHICONE-ZINC OXIDE) CREAM    Apply topically 2 (two) times daily as needed (to peri area).   TIMOLOL MALEATE 0.5 % (DAILY) SOLN    Apply to eye.    Review of Systems  Social History  Substance Use Topics  . Smoking status: Former Smoker -- 1.00 packs/day for 20 years    Types: Cigarettes  . Smokeless tobacco: Never Used  . Alcohol Use: No   Objective:   BP 100/62 mmHg  Pulse 80  Resp 16  Wt 135 lb (61.236 kg)  SpO2 96%  Physical Exam      Assessment & Plan:        Patient has PCP at her facility. Does not need to have 2 PCPs. Will talk with facility regarding Urology referral.       Lorie PhenixNancy Kooper Godshall, MD  [facility]

## 2016-02-17 DIAGNOSIS — N390 Urinary tract infection, site not specified: Secondary | ICD-10-CM | POA: Insufficient documentation

## 2016-03-01 ENCOUNTER — Encounter: Payer: Self-pay | Admitting: Urology

## 2016-03-01 ENCOUNTER — Ambulatory Visit (INDEPENDENT_AMBULATORY_CARE_PROVIDER_SITE_OTHER): Payer: Medicare Other | Admitting: Urology

## 2016-03-01 VITALS — BP 112/72 | HR 89 | Ht 62.0 in | Wt 141.0 lb

## 2016-03-01 DIAGNOSIS — N9089 Other specified noninflammatory disorders of vulva and perineum: Secondary | ICD-10-CM | POA: Diagnosis not present

## 2016-03-01 DIAGNOSIS — N390 Urinary tract infection, site not specified: Secondary | ICD-10-CM | POA: Diagnosis not present

## 2016-03-01 DIAGNOSIS — N952 Postmenopausal atrophic vaginitis: Secondary | ICD-10-CM

## 2016-03-01 LAB — MICROSCOPIC EXAMINATION: RBC, UA: NONE SEEN /hpf (ref 0–?)

## 2016-03-01 LAB — URINALYSIS, COMPLETE
BILIRUBIN UA: NEGATIVE
Glucose, UA: NEGATIVE
KETONES UA: NEGATIVE
LEUKOCYTES UA: NEGATIVE
Nitrite, UA: POSITIVE — AB
SPEC GRAV UA: 1.02 (ref 1.005–1.030)
Urobilinogen, Ur: 0.2 mg/dL (ref 0.2–1.0)
pH, UA: 5.5 (ref 5.0–7.5)

## 2016-03-01 MED ORDER — ESTROGENS, CONJUGATED 0.625 MG/GM VA CREA: 1.0000 | TOPICAL_CREAM | Freq: Every day | VAGINAL | Status: DC

## 2016-03-01 NOTE — Progress Notes (Signed)
03/01/2016 5:32 PM   Paula Summers 1931/04/07 [id number]  Referring provider: Randie Heinz, NP [phone] Paula Summers,  Chief Complaint  Patient presents with  . Recurrent UTI    New Patient    HPI:   82 year old female referred by Dr.Mukamana  for further evaluation/treatment of recurrent urinary tract infections.     She is currently a patient at [facility] dementia unit and is accompanied today by her daughter, Paula Summers.   Paula Summers has severe dementia and is unable to provide her own history today. She was admitted to her facility back in 05/2015.  Since then, she has had episodes of recurrent urinary tract infections treated with multiple rounds of antibiotics. With each infection, her symptoms per her daughter included worsening mental status, hallucinations, and anger/violent behaviors which are not typical. With treatment of her infections, the symptoms resolve.  Her daughter keeps fairly meticulous notes and brings in a UTI timeline demonstrating when she's been treated for infections. These include 7 different occasions over the past 9 months when she's been treated with various antibiotics as well as fluconazole for yeast.  Paula Summers is incontinent and wears a diaper. She is changed by the facility presumably on a regular basis. She occasionally has fecal incontinence but this is somewhat rare.   More recently, she was started on calazimine barrier cream 3 times a day to help with skin care issues.  She does have chronic irritation and recurrent yeast in her diaper area.    Review of 5 separate urine cultures since 08/2015 reveal recurrent infections with Klebsiella and Escherichia coli.  These are essentially pansensitive although the Klebsiella was resistant to ampicillin only.  Prior to living at this facility, her mother had rare urinary tract infections.  Patient was catheterized today in the office at which time a pelvic exam  was performed. This revealed evidence of atrophic vaginitis as well as erythema, inflammation of external genitalia.  Normal urethral meatus.    PMH: Past Medical History  Diagnosis Date  . Glaucoma   . Hypertension   . Personal history of tobacco use, presenting hazards to health   . Carotid stenosis   . Breast screening, unspecified   . Special screening for malignant neoplasms, colon   . Hyperlipidemia   . Alzheimer disease   . Hypothyroidism   . Depression   . Atrial fibrillation [facility])     Surgical History: Past Surgical History  Procedure Laterality Date  . Colonoscopy  2006    [place]  . Carotid endarterectomy Left 2010  . Appendectomy  1940  . Back surgery  8413,[id number]  . Tonsillectomy  1940    Home Medications:    Medication List       This list is accurate as of: 03/01/16  5:32 PM.  Always use your most recent med list.               acetaminophen 500 MG tablet  Commonly known as:  TYLENOL  Take 1,000 mg by mouth every 6 (six) hours as needed.     aspirin 325 MG tablet  Take 325 mg by mouth daily.     Azelastine-Fluticasone 137-50 MCG/ACT Susp  Place into the nose.     conjugated estrogens vaginal cream  Commonly known as:  PREMARIN  Place 1 Applicatorful vaginally daily.     dimethicone-zinc oxide cream  Apply topically 2 (two) times daily as needed (to peri area).     escitalopram 20  MG tablet  Commonly known as:  LEXAPRO  Take 20 mg by mouth daily.     levothyroxine 100 MCG tablet  Commonly known as:  SYNTHROID, LEVOTHROID     metoprolol succinate 25 MG 24 hr tablet  Commonly known as:  TOPROL-XL  Take by mouth.     pravastatin 20 MG tablet  Commonly known as:  PRAVACHOL  Take 20 mg by mouth daily.     pseudoephedrine-guaifenesin 60-600 MG 12 hr tablet  Commonly known as:  MUCINEX D  Take 1 tablet by mouth every 12 (twelve) hours.     Timolol Maleate 0.5 % (DAILY) Soln  Apply to eye.        Allergies:  Allergies  Allergen  Reactions  . Penicillins Hives    Family History: Family History  Problem Relation Age of Onset  . Bladder Cancer Neg Hx   . Prostate cancer Neg Hx   . Kidney cancer Neg Hx     Social History:  reports that she has quit smoking. Her smoking use included Cigarettes. She has a 20 pack-year smoking history. She has never used smokeless tobacco. She reports that she does not drink alcohol or use illicit drugs.  ROS: Unable to obtain due to UTIs  Physical Exam: BP 112/72 mmHg  Pulse 89  Ht  (1.575 m)  Wt 141 lb (63.957 kg)  BMI 25.78 kg/m2  Constitutional:  Alert but not oriented., No acute distress. HEENT: [place] AT, moist mucus membranes.  Trachea midline, no masses. Cardiovascular: No clubbing, cyanosis, or edema. Respiratory: Normal respiratory effort, no increased work of breathing. GI: Abdomen is soft, nontender, nondistended, no abdominal masses GU: No CVA tenderness. Inflamed, erythematous, irritated external genitalia. Normal urethral meatus. Atrophic vaginitis noted. Skin: No rashes, bruises or suspicious lesions. Lymph: No cervical or inguinal adenopathy. Neurologic: Grossly intact, no focal deficits, moving all 4 extremities. Psychiatric: Pleasant Laboratory Data: Lab Results  Component Value Date   WBC 9.5 12/18/2015   HGB 11.4* 12/18/2015   HCT 34.9* 12/18/2015   MCV 94.0 12/18/2015   PLT 248 12/18/2015    Lab Results  Component Value Date   CREATININE 0.86 12/18/2015    Urinalysis Results for orders placed or performed in visit on 03/01/16  Microscopic Examination  Result Value Ref Range   WBC, UA 0-5 0 -  5 /hpf   RBC, UA None seen 0 -  2 /hpf   Epithelial Cells (non renal) 0-10 0 - 10 /hpf   Crystals Present (A) N/A   Crystal Type Amorphous Sediment N/A   Bacteria, UA Few (A) None seen/Few   Yeast, UA Present (A) None seen  Urinalysis, Complete  Result Value Ref Range   Specific Gravity, UA 1.020 1.005 - 1.030   pH, UA 5.5 5.0 - 7.5   Color,  UA Yellow Yellow   Appearance Ur Clear Clear   Leukocytes, UA Negative Negative   Protein, UA Trace (A) Negative/Trace   Glucose, UA Negative Negative   Ketones, UA Negative Negative   RBC, UA 1+ (A) Negative   Bilirubin, UA Negative Negative   Urobilinogen, Ur 0.2 0.2 - 1.0 mg/dL   Nitrite, UA Positive (A) Negative   Microscopic Examination See below:     Pertinent Imaging: N/a  Assessment & Plan:    1. Recurrent UTI Urine cultures reviewed. Recurrent Escherichia coli and Klebsiella infections which are likely caused by or at least exacerbated by hygiene issues. I suspect that she is chronically colonized based on  her UA today without evidence of bladder inflammation/leukocytosis but nitrite positive. As such, I'm hesitant to treat her urine based on UA alone in the absence of symptoms. We will continue to treat as needed for mental status changes and urinary complaints.    I have recommended several hygiene interventions including using sanitary water wipes, wiping front to back, and changing her diaper regularly, and continuation of barrier salve.  In addition, I do think that she benefit from vaginal estrogen cream applied to her periurethral/vaginal area, pea-sized amount Monday Wednesday and Friday. Her daughter is willing to apply this as described.  It also recommended resuming cranberry tabs at this likely won't hurt.  I explained that I'm hesitant to prescribe chronic suppressive antibiotics given that she is colonized and for the purpose of antibiotics stewardship. We'll plan to continue to treat with antibiotics only as needed. She will call our office and make an appointment to obtain a catheterized specimen only for UA/urine culture and we'll treat based on sensitivity data.  - Urinalysis, Complete - BLADDER SCAN AMB NON-IMAGING  2. Atrophic vaginitis As above  3. Labial irritation As above   Return if symptoms worsen or fail to improve.  Vanna ScotlandAshley Nanette Wirsing,  MD  [facility] [address], [address] [place], [place] [address] [phone]

## 2016-03-13 ENCOUNTER — Other Ambulatory Visit: Payer: Self-pay

## 2016-09-01 ENCOUNTER — Emergency Department
Admission: EM | Admit: 2016-09-01 | Discharge: 2016-09-01 | Disposition: A | Discharge status: Home / Self Care | Payer: Medicare Other | Attending: Emergency Medicine | Admitting: Emergency Medicine

## 2016-09-01 ENCOUNTER — Emergency Department: Payer: Medicare Other

## 2016-09-01 DIAGNOSIS — F0391 Unspecified dementia with behavioral disturbance: Secondary | ICD-10-CM | POA: Diagnosis not present

## 2016-09-01 DIAGNOSIS — I110 Hypertensive heart disease with heart failure: Secondary | ICD-10-CM | POA: Diagnosis not present

## 2016-09-01 DIAGNOSIS — E039 Hypothyroidism, unspecified: Secondary | ICD-10-CM | POA: Diagnosis not present

## 2016-09-01 DIAGNOSIS — Z79899 Other long term (current) drug therapy: Secondary | ICD-10-CM | POA: Diagnosis not present

## 2016-09-01 DIAGNOSIS — S0993XA Unspecified injury of face, initial encounter: Secondary | ICD-10-CM | POA: Diagnosis present

## 2016-09-01 DIAGNOSIS — Y939 Activity, unspecified: Secondary | ICD-10-CM | POA: Insufficient documentation

## 2016-09-01 DIAGNOSIS — Y999 Unspecified external cause status: Secondary | ICD-10-CM | POA: Diagnosis not present

## 2016-09-01 DIAGNOSIS — Z7982 Long term (current) use of aspirin: Secondary | ICD-10-CM | POA: Insufficient documentation

## 2016-09-01 DIAGNOSIS — S0181XA Laceration without foreign body of other part of head, initial encounter: Secondary | ICD-10-CM | POA: Insufficient documentation

## 2016-09-01 DIAGNOSIS — Z791 Long term (current) use of non-steroidal anti-inflammatories (NSAID): Secondary | ICD-10-CM | POA: Diagnosis not present

## 2016-09-01 DIAGNOSIS — W06XXXA Fall from bed, initial encounter: Secondary | ICD-10-CM | POA: Diagnosis not present

## 2016-09-01 DIAGNOSIS — Z87891 Personal history of nicotine dependence: Secondary | ICD-10-CM | POA: Diagnosis not present

## 2016-09-01 DIAGNOSIS — Y9289 Other specified places as the place of occurrence of the external cause: Secondary | ICD-10-CM | POA: Diagnosis not present

## 2016-09-01 DIAGNOSIS — I509 Heart failure, unspecified: Secondary | ICD-10-CM | POA: Insufficient documentation

## 2016-09-01 HISTORY — DX: Heart failure, unspecified: I50.9

## 2016-09-01 LAB — CBC WITH DIFFERENTIAL/PLATELET
Basophils Absolute: 0.1 10*3/uL (ref 0–0.1)
Basophils Relative: 1 %
EOS PCT: 1 %
Eosinophils Absolute: 0.1 10*3/uL (ref 0–0.7)
HCT: 39.2 % (ref 35.0–47.0)
Hemoglobin: 12.8 g/dL (ref 12.0–16.0)
LYMPHS ABS: 1.2 10*3/uL (ref 1.0–3.6)
LYMPHS PCT: 18 %
MCH: 28.5 pg (ref 26.0–34.0)
MCHC: 32.6 g/dL (ref 32.0–36.0)
MCV: 87.5 fL (ref 80.0–100.0)
MONO ABS: 0.5 10*3/uL (ref 0.2–0.9)
MONOS PCT: 7 %
Neutro Abs: 4.9 10*3/uL (ref 1.4–6.5)
Neutrophils Relative %: 73 %
PLATELETS: 242 10*3/uL (ref 150–440)
RBC: 4.48 MIL/uL (ref 3.80–5.20)
RDW: 16.6 % — AB (ref 11.5–14.5)
WBC: 6.6 10*3/uL (ref 3.6–11.0)

## 2016-09-01 LAB — BASIC METABOLIC PANEL
Anion gap: 7 (ref 5–15)
BUN: 20 mg/dL (ref 6–20)
CHLORIDE: 103 mmol/L (ref 101–111)
CO2: 25 mmol/L (ref 22–32)
Calcium: 9.1 mg/dL (ref 8.9–10.3)
Creatinine, Ser: 0.97 mg/dL (ref 0.44–1.00)
GFR calc Af Amer: >60 mL/min (ref >60)
GFR calc non Af Amer: 53 mL/min — ABNORMAL LOW (ref >60)
GLUCOSE: 108 mg/dL — AB (ref 65–99)
POTASSIUM: 4.1 mmol/L (ref 3.5–5.1)
Sodium: 135 mmol/L (ref 135–145)

## 2016-09-01 LAB — URINALYSIS COMPLETE WITH MICROSCOPIC (ARMC ONLY)
BILIRUBIN URINE: NEGATIVE
GLUCOSE, UA: NEGATIVE mg/dL
KETONES UR: NEGATIVE mg/dL
LEUKOCYTES UA: NEGATIVE
NITRITE: NEGATIVE
PH: 5 (ref 5.0–8.0)
Protein, ur: NEGATIVE mg/dL
SPECIFIC GRAVITY, URINE: 1.01 (ref 1.005–1.030)

## 2016-09-01 MED ORDER — LORAZEPAM 0.5 MG PO TABS
0.5000 mg | ORAL_TABLET | Freq: Once | ORAL | Status: AC
  Administered 2016-09-01: 0.5 mg via ORAL
  Filled 2016-09-01: qty 1

## 2016-09-01 MED ORDER — LORAZEPAM 0.5 MG PO TABS: 0.5000 mg | ORAL_TABLET | Freq: Three times a day (TID) | ORAL | 0 refills | Status: AC | PRN
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
## 2016-09-01 NOTE — ED Notes (Signed)
MD Lord applied glue to chin lac.

## 2016-09-01 NOTE — ED Notes (Signed)
Pt cleaned and changed, new brief applied.

## 2016-09-01 NOTE — ED Provider Notes (Signed)
[facility] Emergency Department Provider Note ____________________________________________   I have reviewed the triage vital signs and the triage nursing note.  HISTORY  Chief Complaint Fall   Historian Level 5 caveat - dementia Patient's daughter  HPI Paula Summers is a 82 y.o. female from a group home here for evaluation of laceration to the face after fall in the night.  Patient has dementia, but apparently the roommate stated she got up and tripped getting out of bed in the early morning.  Patient herself is denying any neck pain, chest pain, back pain, extremity pain or abdominal pain.  Daughter states she was not satisfied with the care at prior nursing home and so patient was moved to a group home just a few days ago and is just getting used to the new environment. She has been a little agitated in terms of her dementia and is requesting something to help with that.  She is on aspirin but no other blood thinners.    Past Medical History:  Diagnosis Date  . Alzheimer disease   . Atrial fibrillation (HCC)   . Breast screening, unspecified   . Carotid stenosis   . CHF (congestive heart failure) (HCC)   . Depression   . Glaucoma   . Hyperlipidemia   . Hypertension   . Hypothyroidism   . Personal history of tobacco use, presenting hazards to health   . Special screening for malignant neoplasms, colon     Patient Active Problem List   Diagnosis Date Noted  . Recurrent UTI 02/17/2016  . A-fib (HCC) 02/16/2016  . Clinical depression 02/16/2016  . Dementia 02/16/2016  . Hypercholesteremia 02/16/2016  . Temporary cerebral vascular dysfunction 02/16/2016  . Excessive falling 02/16/2016  . Gait instability 02/16/2016  . Carotid stenosis   . Cholelithiasis without obstruction 05/14/2007  . Adult hypothyroidism 09/08/2006  . Allergic rhinitis 02/12/2006  . Colon, diverticulosis 02/12/2006  . Generalized OA 02/12/2006  . Glaucoma,  compensated 02/12/2006  . BP (high blood pressure) 02/12/2006    Past Surgical History:  Procedure Laterality Date  . APPENDECTOMY  1940  . BACK SURGERY  [phone]  . CAROTID ENDARTERECTOMY Left 2010  . COLONOSCOPY  2006   [place]  . TONSILLECTOMY  1940    Prior to Admission medications   Medication Sig Start Date End Date Taking? Authorizing Provider  acetaminophen (TYLENOL) 325 MG tablet Take 650 mg by mouth daily.   Yes Historical Provider, MD  acetaminophen (TYLENOL) 500 MG tablet Take 500 mg by mouth at bedtime.    Yes Historical Provider, MD  Azelastine-Fluticasone 137-50 MCG/ACT SUSP Place into the nose.   Yes Historical Provider, MD  Cranberry 450 MG CAPS Take 1 capsule by mouth 2 (two) times daily.   Yes Historical Provider, MD  escitalopram (LEXAPRO) 20 MG tablet Take 20 mg by mouth daily.   Yes Historical Provider, MD  levothyroxine (SYNTHROID, LEVOTHROID) 100 MCG tablet  02/19/16  Yes Historical Provider, MD  metoprolol succinate (TOPROL-XL) 25 MG 24 hr tablet Take by mouth. 01/24/14  Yes Historical Provider, MD  potassium chloride (K-DUR) 10 MEQ tablet Take 10 mEq by mouth daily.   Yes Historical Provider, MD  pravastatin (PRAVACHOL) 20 MG tablet Take 20 mg by mouth daily.   Yes Historical Provider, MD  Skin Protectants, Misc. (DIMETHICONE-ZINC OXIDE) cream Apply topically 2 (two) times daily as needed (to peri area).   Yes Historical Provider, MD  timolol (TIMOPTIC) 0.5 % ophthalmic solution Place 1 drop into  both eyes 2 (two) times daily.    Yes Historical Provider, MD  aspirin 325 MG tablet Take 325 mg by mouth daily.    Historical Provider, MD  conjugated estrogens (PREMARIN) vaginal cream Place 1 Applicatorful vaginally daily. Patient not taking: Reported on 09/01/2016 03/01/16   Vanna Scotland, MD  LORazepam (ATIVAN) 0.5 MG tablet Take 1 tablet (0.5 mg total) by mouth every 8 (eight) hours as needed for anxiety. 09/01/16 09/01/17  Governor Rooks, MD    Allergies  Allergen  Reactions  . Penicillins Hives    Family History  Problem Relation Age of Onset  . Bladder Cancer Neg Hx   . Prostate cancer Neg Hx   . Kidney cancer Neg Hx     Social History Social History  Substance Use Topics  . Smoking status: Former Smoker    Packs/day: 1.00    Years: 20.00    Types: Cigarettes  . Smokeless tobacco: Never Used  . Alcohol use No    Review of Systems  Constitutional: Negative for fever. Eyes: Negative for visual changes. ENT: Negative for sore throat. Cardiovascular: Negative for chest pain. Respiratory: Negative for shortness of breath. Gastrointestinal: Negative for abdominal pain, vomiting and diarrhea. Genitourinary: Negative for dysuria. Musculoskeletal: Negative for back pain. Skin: Negative for rash. Neurological: Negative for headache. 10 point Review of Systems otherwise negative ____________________________________________   PHYSICAL EXAM:  VITAL SIGNS: ED Triage Vitals [09/01/16 0910]  Enc Vitals Group     BP 136/65     Pulse Rate 74     Resp 14     Temp 97.5 F (36.4 C)     Temp Source Oral     SpO2 93 %     Weight 140 lb 3.2 oz (63.6 kg)     Height 5\' 4"  (1.626 m)     Head Circumference      Peak Flow      Pain Score      Pain Loc      Pain Edu?      Excl. in GC?      Constitutional: Alert and Cooperative but disoriented. Well appearing and in no distress. HEENT   Head: Normocephalic.  No evidence of trauma to the scalp. She does have a 4 cm laceration to the left jaw area.      Eyes: Conjunctivae are normal. PERRL. Normal extraocular movements.      Ears:         Nose: No congestion/rhinnorhea.   Mouth/Throat: Mucous membranes are moist. Poor dentition, but no obvious dental trauma.   Neck: No stridor.  No step-offs of the C-spine. No posterior midline tenderness to palpation or range of motion. Cardiovascular/Chest: Normal rate, regular rhythm.  No murmurs, rubs, or gallops. Respiratory: Normal  respiratory effort without tachypnea nor retractions. Breath sounds are clear and equal bilaterally. No wheezes/rales/rhonchi. Gastrointestinal: Soft. No distention, no guarding, no rebound. Nontender.    Genitourinary/rectal:Deferred Musculoskeletal: Pelvis is stable. Nontender with normal range of motion in all extremities. No joint effusions.  No lower extremity tenderness.  No edema.  She does have multiple old-appearing bruises to both forearms and the inner thighs. Neurologic:  No slurred speech. Normal speech and language. No gross or focal neurologic deficits are appreciated. Skin:  Skin is warm, dry and intact. No rash noted. Psychiatric: At times a little agitated, but mostly redirectable..   ____________________________________________  LABS (pertinent positives/negatives)  Labs Reviewed  URINALYSIS COMPLETEWITH MICROSCOPIC ([facility] ONLY) - Abnormal; Notable for the following:  Result Value   Color, Urine YELLOW (*)    APPearance CLEAR (*)    Hgb urine dipstick 1+ (*)    Bacteria, UA RARE (*)    Squamous Epithelial / LPF 0-5 (*)    All other components within normal limits  BASIC METABOLIC PANEL - Abnormal; Notable for the following:    Glucose, Bld 108 (*)    GFR calc non Af Amer 53 (*)    All other components within normal limits  CBC WITH DIFFERENTIAL/PLATELET - Abnormal; Notable for the following:    RDW 16.6 (*)    All other components within normal limits  URINE CULTURE    ____________________________________________    EKG I, Governor Rooks, MD, the attending physician have personally viewed and interpreted all ECGs.  None ____________________________________________  RADIOLOGY All Xrays were viewed by me. Imaging interpreted by Radiologist.  CT head without contrast: No acute intracranial abdomen [facility]. Cerebral atrophy and small vessel ischemic change. __________________________________________  PROCEDURES  Procedure(s) performed: LACERATION  REPAIR Performed by: Governor Rooks Authorized by: Governor Rooks Consent: Verbal consent obtained. Risks and benefits: risks, benefits and alternatives were discussed Consent given by: patient/daughter  Laceration Location: left jaw/face  Laceration Length: 4cm  No Foreign Bodies seen or palpated  Irrigation method: syringe Amount of cleaning: standard   Technique: Skin adhesive  Patient tolerance: Patient tolerated the procedure well with no immediate complications.  Critical Care performed: None  ____________________________________________   ED COURSE / ASSESSMENT AND PLAN  Pertinent labs & imaging results that were available during my care of the patient were reviewed by me and considered in my medical decision making (see chart for details).   Ms. Kosel was brought in by her daughter from a group home after apparent unwitnessed fall overnight, although the patient has dementia and is able to provide a reasonable history, the roommate apparently was reliable and stated that she had tripped trying to get out of bed.  Daughter is most concerned about the possibility for urinary tract infection, and treatment of the facial laceration. We discussed at length whether or not to do any additional imaging especially at the head, it was an unwitnessed fall but she has also had some increase in agitation over the last couple weeks, felt likely to be behavioral related to new environment in change of living situation, but we did decide to proceed with head CT.  Head CT is negative for acute findings or cause for her agitation.  I suspect agitation is due to the underlying dementia. I did treat her with a dose of Ativan here which seemed to help some and I will give a prescription for some as needed doses. Ultimately I spoke with the daughter that she needs to have a primary care physician I just a more long-term solution for this.  I'm not suspicious for any other significant  traumatic injury. Skin laceration was repaired with adhesive.  CONSULTATIONS:  None  Patient / Family / Caregiver informed of clinical course, medical decision-making process, and agree with plan.   I discussed return precautions, follow-up instructions, and discharge instructions with patient and/or family.   ___________________________________________   FINAL CLINICAL IMPRESSION(S) / ED DIAGNOSES   Final diagnoses:  Facial laceration, initial encounter  Dementia, with behavioral disturbance              Note: This dictation was prepared with Dragon dictation. Any transcriptional errors that result from this process are unintentional    Governor Rooks, MD 09/01/16 1208

## 2016-09-01 NOTE — Discharge Instructions (Signed)
You were evaluated after a fall, and your face laceration was glued. This will dissolve and pedal off in about 7-14 days.  Return to the emergency department for any worsening condition including new injury, or dangerous behavior to self or others.

## 2016-09-02 LAB — URINE CULTURE

## 2016-09-04 IMAGING — CT CT HEAD W/O CM
2 of 4 series · 15 of 30 positions shown, 17 images · non-contrast
Comparison: CT scan of April 29, 2014.

CLINICAL DATA: Unwitnessed fall at nursing facility. No reported
loss of consciousness. Left forehead hematoma. History of
Alzheimer's disease.

EXAM:
CT HEAD WITHOUT CONTRAST
CT CERVICAL SPINE WITHOUT CONTRAST
TECHNIQUE: Multidetector CT imaging of the head and cervical spine was
performed following the standard protocol without intravenous
contrast. Multiplanar CT image reconstructions of the cervical spine
were also generated.

[Series 7: head bone · axial · 0.49mm/px · z∈[-102,+9]mm · 8 of 96 slices shown]
[im 11/96  bone]
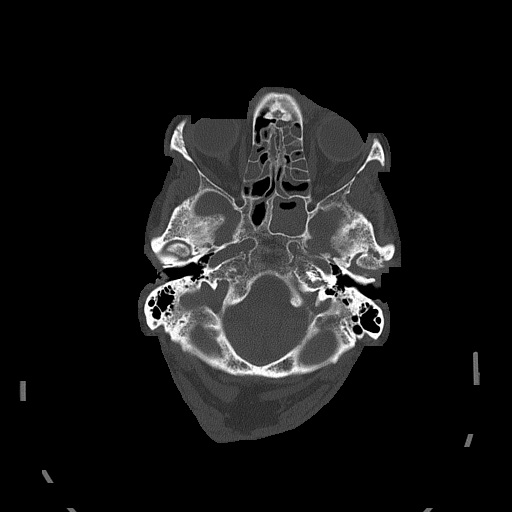
[im 22/96  bone]
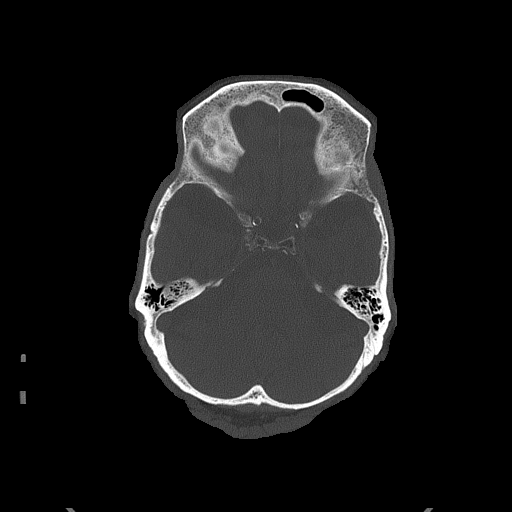
[im 32/96  bone]
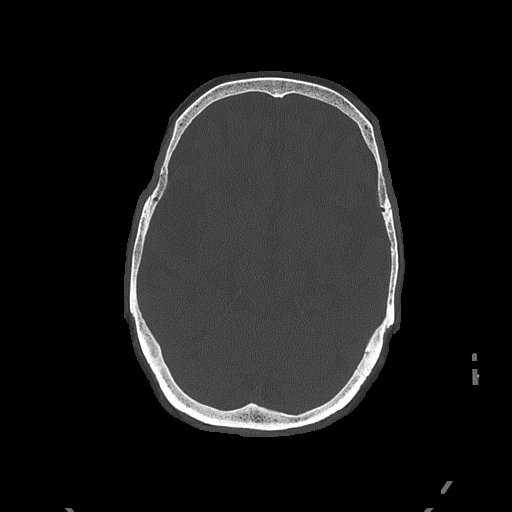
[im 43/96  bone]
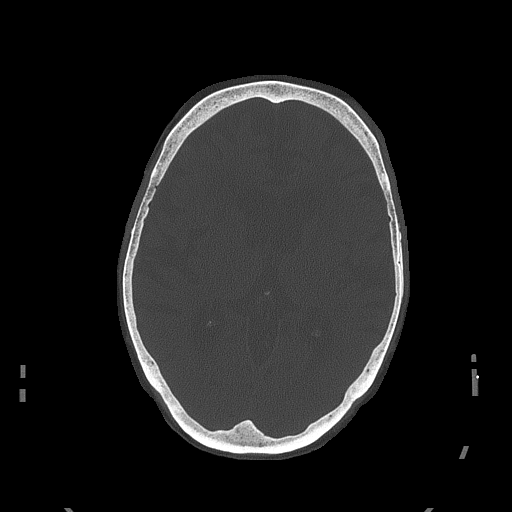
[im 53/96  bone]
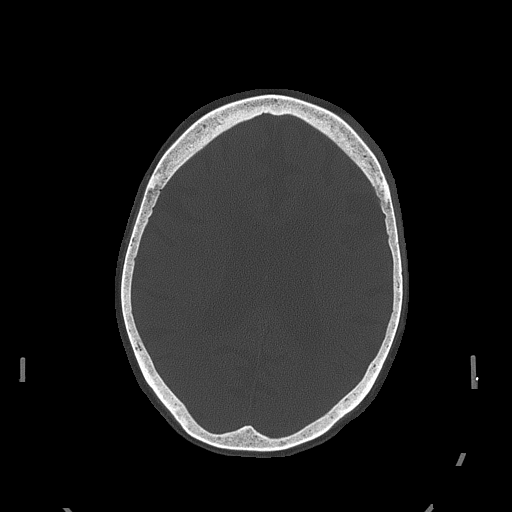
[im 64/96  bone]
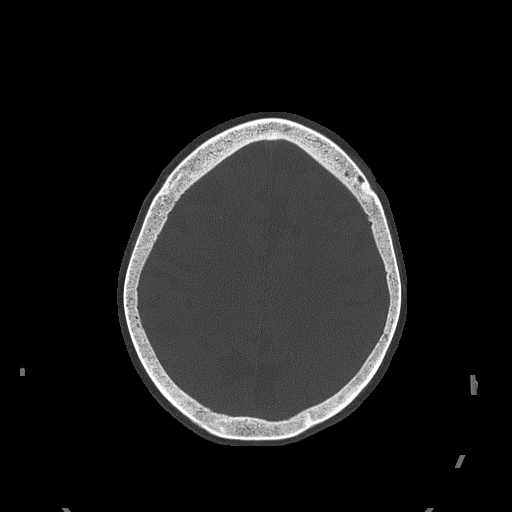
[im 74/96  bone]
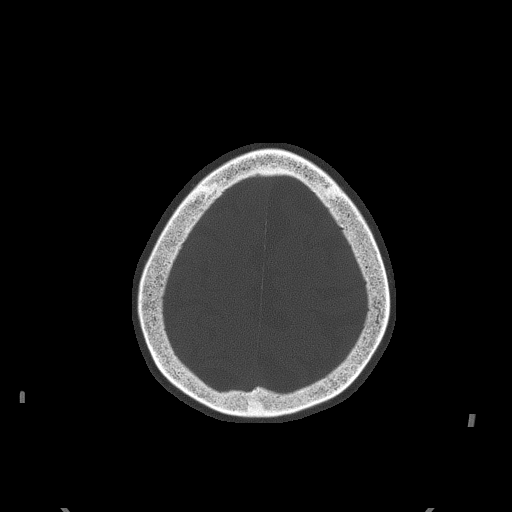
[im 85/96  bone]
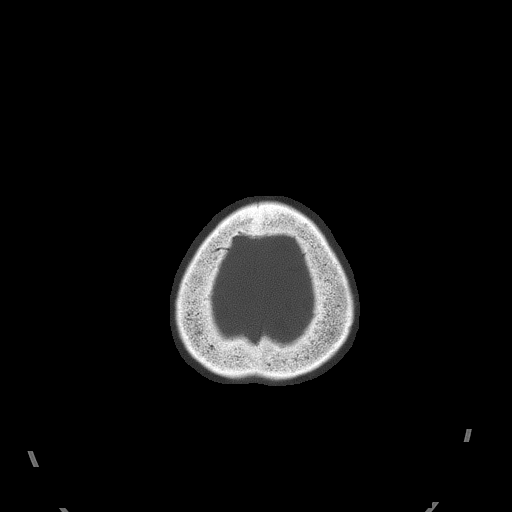

[Series 10: orthogonal axials · axial · 0.20mm/px · z∈[-268,-158]mm · 7 of 83 slices shown, 9 images]
[im 11/83  brain]
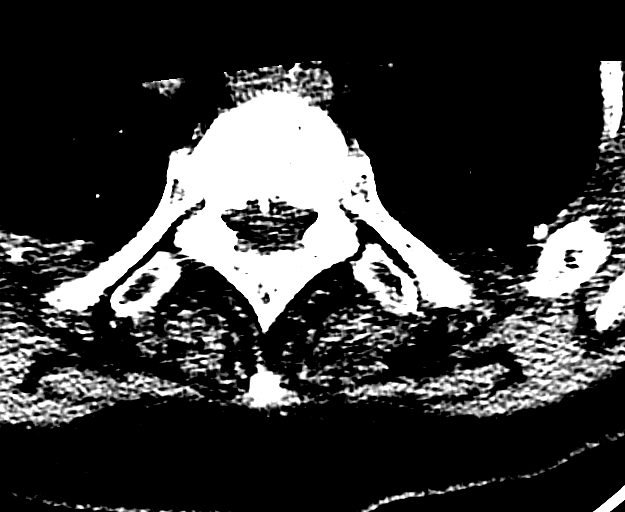
[im 11/83  bone]
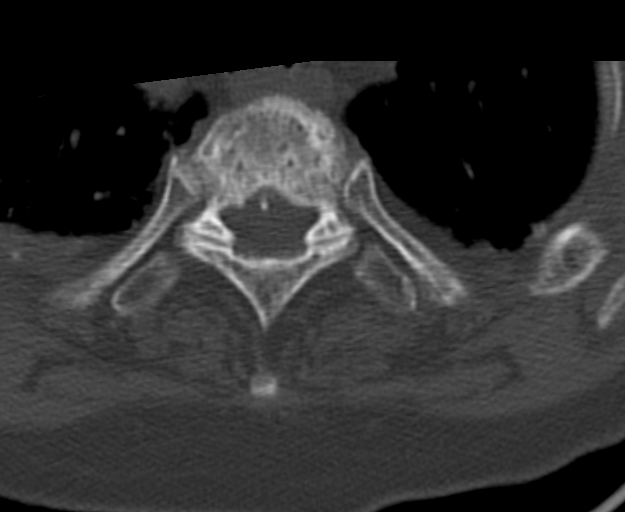
[im 21/83  brain]
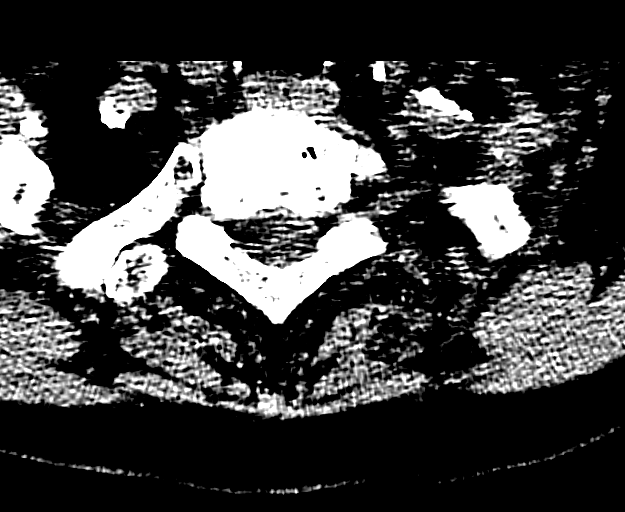
[im 31/83  brain]
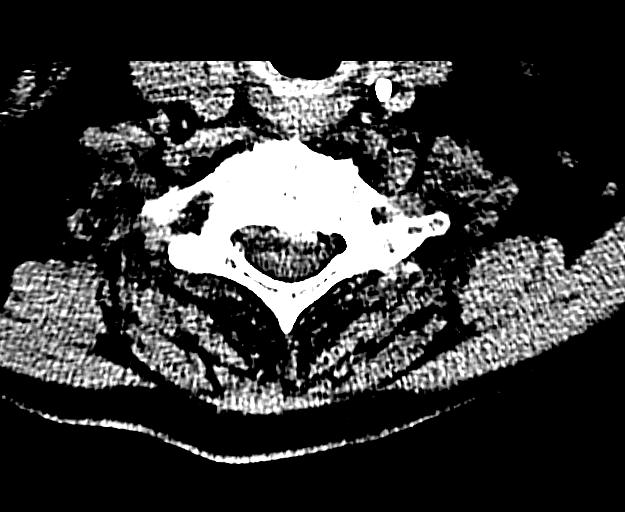
[im 42/83  brain]
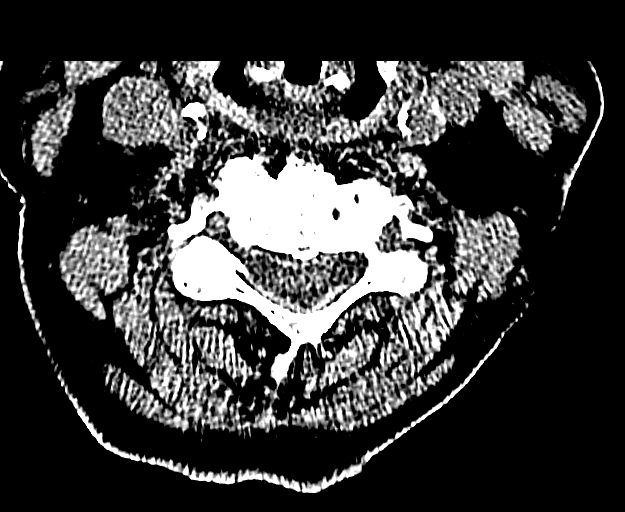
[im 52/83  brain]
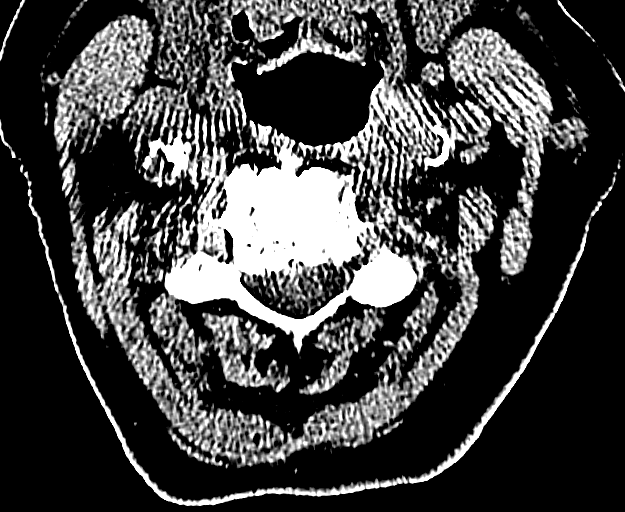
[im 52/83  bone]
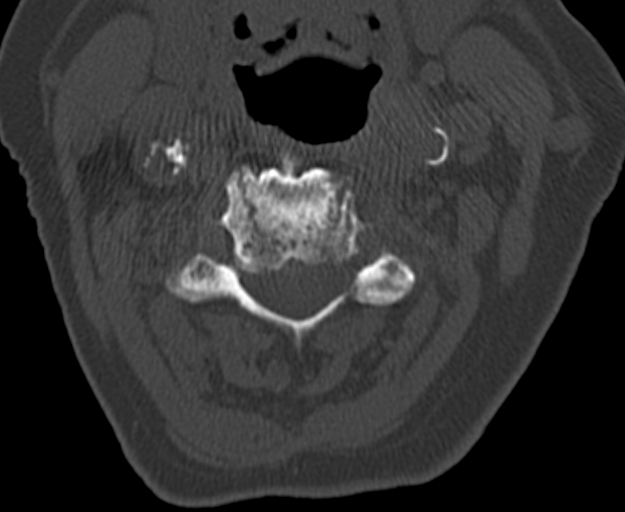
[im 62/83  brain]
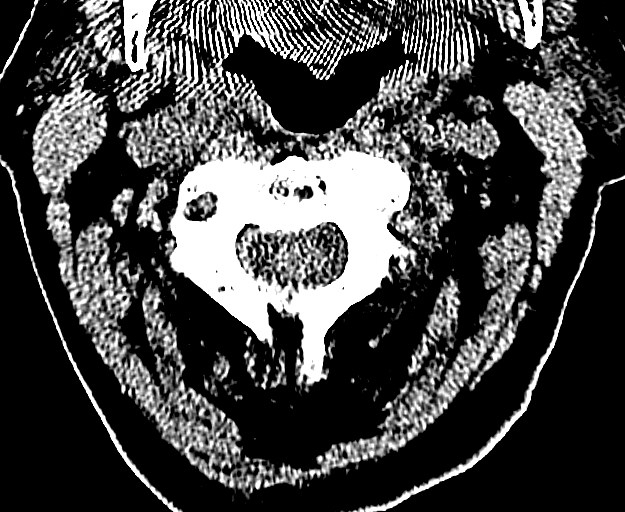
[im 72/83  brain]
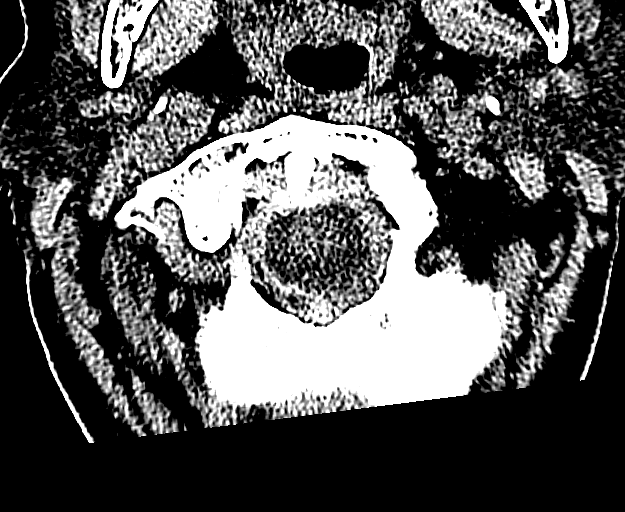

[15 of 30 positions shown; findings below may reference images not displayed]

FINDINGS: CT HEAD FINDINGS

Bilateral ethmoid, sphenoid and maxillary sinusitis is noted.
Otherwise bony calvarium is unremarkable. Moderate diffuse cortical
atrophy is noted. Moderate chronic ischemic white matter disease is
noted. Mild left frontal scalp hematoma is noted. No mass effect or
midline shift is noted. Ventricular size is within normal limits.
There is no evidence of mass lesion or acute infarction. Small
subdural hematoma is seen in left temporal region.

CT CERVICAL SPINE FINDINGS

Reversal of normal lordosis of cervical spine is noted. Severe
degenerative disc disease is noted at all visualized levels of the
cervical spine and upper thoracic spine. No fracture or significant
spondylolisthesis is noted. Visualized lung apices are unremarkable.
IMPRESSION: Bilateral ethmoid, sphenoid and maxillary sinusitis.

Mild left frontal scalp hematoma is noted.

Moderate diffuse cortical atrophy and chronic ischemic white matter
disease is noted.

Small left temporal subdural hematoma is noted.

Severe multilevel degenerative disc disease is noted in the cervical
spine. No fracture or spondylolisthesis is noted.

Critical Value/emergent results were called by telephone at the time
of interpretation on 12/18/2015 at [DATE] to Dr. JEFREY VADAKARA , who
verbally acknowledged these results.

## 2016-09-05 ENCOUNTER — Encounter: Payer: Self-pay | Admitting: Family Medicine

## 2016-09-05 ENCOUNTER — Ambulatory Visit (INDEPENDENT_AMBULATORY_CARE_PROVIDER_SITE_OTHER): Payer: Medicare Other | Admitting: Family Medicine

## 2016-09-05 VITALS — BP 122/78 | HR 58 | Temp 97.5°F

## 2016-09-05 DIAGNOSIS — G308 Other Alzheimer's disease: Secondary | ICD-10-CM

## 2016-09-05 DIAGNOSIS — R296 Repeated falls: Secondary | ICD-10-CM

## 2016-09-05 DIAGNOSIS — R2689 Other abnormalities of gait and mobility: Secondary | ICD-10-CM | POA: Diagnosis not present

## 2016-09-05 DIAGNOSIS — T148 Other injury of unspecified body region: Secondary | ICD-10-CM

## 2016-09-05 DIAGNOSIS — F0281 Dementia in other diseases classified elsewhere with behavioral disturbance: Secondary | ICD-10-CM

## 2016-09-05 DIAGNOSIS — G309 Alzheimer's disease, unspecified: Principal | ICD-10-CM

## 2016-09-05 DIAGNOSIS — T07XXXA Unspecified multiple injuries, initial encounter: Secondary | ICD-10-CM

## 2016-09-05 NOTE — Progress Notes (Signed)
Patient: Paula Summers Female    DOB: August 16, 1931   82 y.o.   MRN: [id number] Visit Date: 09/05/2016  Today's Provider: Dortha Kern, PA   Chief Complaint  Patient presents with  . Dementia    evaluation for new assisted living facility   Subjective:    HPI Patient is here today for a evaluation to switch assisted living facilities. Patient is currently at [facility]. Patient recently moved from [facility]. Patient has a history of dementia, excessive falling and gait instability with aggressive/combative behavior.   Past Medical History:  Diagnosis Date  . Alzheimer disease   . Atrial fibrillation (HCC)   . Breast screening, unspecified   . Carotid stenosis   . CHF (congestive heart failure) (HCC)   . Depression   . Glaucoma   . Hyperlipidemia   . Hypertension   . Hypothyroidism   . Personal history of tobacco use, presenting hazards to health   . Special screening for malignant neoplasms, colon    Past Surgical History:  Procedure Laterality Date  . APPENDECTOMY  1940  . BACK SURGERY  [phone]  . CAROTID ENDARTERECTOMY Left 2010  . COLONOSCOPY  2006   [place]  . TONSILLECTOMY  1940   Family History  Problem Relation Age of Onset  . Bladder Cancer Neg Hx   . Prostate cancer Neg Hx   . Kidney cancer Neg Hx    Allergies  Allergen Reactions  . Penicillins Hives     Previous Medications   ACETAMINOPHEN (TYLENOL) 325 MG TABLET    Take 650 mg by mouth daily.   ACETAMINOPHEN (TYLENOL) 500 MG TABLET    Take 500 mg by mouth at bedtime.    ASPIRIN 325 MG TABLET    Take 325 mg by mouth daily.   AZELASTINE-FLUTICASONE 137-50 MCG/ACT SUSP    Place into the nose.   CRANBERRY 450 MG CAPS    Take 1 capsule by mouth 2 (two) times daily.   ESCITALOPRAM (LEXAPRO) 20 MG TABLET    Take 20 mg by mouth daily.   LEVOTHYROXINE (SYNTHROID, LEVOTHROID) 100 MCG TABLET       LORAZEPAM (ATIVAN) 0.5 MG TABLET    Take 1 tablet (0.5 mg total) by mouth every 8 (eight) hours  as needed for anxiety.   METOPROLOL SUCCINATE (TOPROL-XL) 25 MG 24 HR TABLET    Take by mouth.   POTASSIUM CHLORIDE (K-DUR) 10 MEQ TABLET    Take 10 mEq by mouth daily.   PRAVASTATIN (PRAVACHOL) 20 MG TABLET    Take 20 mg by mouth daily.   SKIN PROTECTANTS, MISC. (DIMETHICONE-ZINC OXIDE) CREAM    Apply topically 2 (two) times daily as needed (to peri area).   TIMOLOL (TIMOPTIC) 0.5 % OPHTHALMIC SOLUTION    Place 1 drop into both eyes 2 (two) times daily.     Review of Systems  Constitutional: Negative.   Respiratory: Negative.   Cardiovascular: Negative.   Musculoskeletal: Positive for gait problem.    Social History  Substance Use Topics  . Smoking status: Former Smoker    Packs/day: 1.00    Years: 20.00    Types: Cigarettes  . Smokeless tobacco: Never Used  . Alcohol use No   Objective:   BP 122/78 (BP Location: Left Arm, Patient Position: Sitting, Cuff Size: Normal)   Pulse (!) 58   Temp 97.5 F (36.4 C) (Oral)   Physical Exam  Constitutional: She appears well-developed and well-nourished.  HENT:  Head: Normocephalic.  Right  Ear: External ear normal.  Left Ear: External ear normal.  Nose: Nose normal.  Mouth/Throat: Oropharynx is clear and moist.  Bruising of the left cheek at the laceration site. Also some heavy bruising the right side of her face from the fall she sustained on 09-01-16.   Eyes: Conjunctivae and EOM are normal. Pupils are equal, round, and reactive to light.  Neck: Neck supple.  Cardiovascular: Normal rate and regular rhythm.   Pulmonary/Chest: Effort normal and breath sounds normal.  Abdominal: Soft. Bowel sounds are normal.  Neurological:  Disoriented with history of dementia. Poor balance and weakness in general.  Psychiatric:  Dementia with occasional aggressive/combative behavior.      Assessment & Plan:     1. Alzheimer's dementia with behavioral disturbance, unspecified timing of dementia onset Diagnosed 5-7 years ago. Did not tolerate  the Aricept. Confused and poor balance issues. Caregiver reports episodes of aggressive/combative behavior. Will allow to use Escitalopram, Lorazepam prn and schedule psychiatry referral to assess behavior. May need other medications to control combative behavior. [facility] been transferred to [facility] for closer assistance and supervision.  2. Repeated falls Hospital visit 09-01-16 due to fall and facial laceration. Large bruises remain right side of face and around healed laceration on the left side of face. Laceration healing well and sutures removed.   3. Poor balance  Weakness and poor balance with dementia. Has a history of Lumbar laminectomy in 1989. Had fusion and decompression surgery in 2004. Scheduled for physical and occupational therapy evaluation with treatment.  4. Multiple contusions Secondary to repeated falls due to poor balance and dementia.

## 2016-09-09 ENCOUNTER — Telehealth: Payer: Self-pay | Admitting: Family Medicine

## 2016-09-09 NOTE — Telephone Encounter (Signed)
Patient daughter states that she wants to talk with you about you sending her to physiatrist. Wants to know who you are sending her to and why. Also what medicine you prescribe to her at last visit she was here  CB# [phone]

## 2016-09-09 NOTE — Telephone Encounter (Signed)
Daughter Paula Summers(Paula Summers) wanted to touch base on this patient. States she has been falling more and she has an appointment to see Dr. Sherryll BurgerShah in a week regarding balance issues and possibly falling more due to medications. Advised her a psychiatry referral was being arranged to be sure aggressive behavior did not need different medications. She agreed with this plan.

## 2016-10-22 DIAGNOSIS — R531 Weakness: Secondary | ICD-10-CM | POA: Diagnosis not present

## 2016-10-22 DIAGNOSIS — R2689 Other abnormalities of gait and mobility: Secondary | ICD-10-CM | POA: Diagnosis not present

## 2016-10-22 DIAGNOSIS — F0391 Unspecified dementia with behavioral disturbance: Secondary | ICD-10-CM | POA: Diagnosis not present

## 2016-10-22 DIAGNOSIS — R296 Repeated falls: Secondary | ICD-10-CM | POA: Diagnosis not present

## 2016-12-11 ENCOUNTER — Other Ambulatory Visit: Payer: Self-pay

## 2016-12-11 MED ORDER — CIPROFLOXACIN HCL 500 MG PO TABS: 500.0000 mg | ORAL_TABLET | Freq: Two times a day (BID) | ORAL | 0 refills | Status: DC

## 2016-12-11 NOTE — Telephone Encounter (Signed)
Dr. Sherrie MustacheFisher reviewed urinalysis done on 12/10/2016 at Dr. Margaretmary EddyShah's office. Cipro 500 mg sent to [facility] to treat UTI. Patient's daughter Liborio NixonJanice advised.

## 2016-12-13 ENCOUNTER — Other Ambulatory Visit: Payer: Self-pay

## 2016-12-13 MED ORDER — POTASSIUM CHLORIDE ER 10 MEQ PO TBCR: 10.0000 meq | EXTENDED_RELEASE_TABLET | Freq: Every day | ORAL | 3 refills | Status: AC

## 2016-12-13 MED ORDER — METOPROLOL SUCCINATE ER 25 MG PO TB24: 25.0000 mg | ORAL_TABLET | Freq: Every day | ORAL | 3 refills | Status: AC

## 2016-12-13 MED ORDER — ASPIRIN 325 MG PO TABS: 325.0000 mg | ORAL_TABLET | Freq: Every day | ORAL | 3 refills | Status: AC

## 2016-12-13 MED ORDER — PRAVASTATIN SODIUM 20 MG PO TABS
20.0000 mg | ORAL_TABLET | Freq: Every day | ORAL | 3 refills | Status: AC
Start: 2016-12-13 — End: ?

## 2016-12-13 NOTE — Telephone Encounter (Signed)
Need recheck appointment in 10-14 days to be sure UTI has cleared.

## 2016-12-13 NOTE — Telephone Encounter (Signed)
Refill request received from [facility]

## 2016-12-13 NOTE — Telephone Encounter (Signed)
Left patient's daughter Liborio NixonJanice a voicemail to return call to schedule a follow up appointment in 10-14 days to be sure UTI has cleared.

## 2016-12-14 ENCOUNTER — Emergency Department: Payer: Medicare HMO

## 2016-12-14 ENCOUNTER — Encounter: Payer: Self-pay | Admitting: Emergency Medicine

## 2016-12-14 ENCOUNTER — Inpatient Hospital Stay
Admission: EM | Admit: 2016-12-14 | Discharge: 2016-12-23 | DRG: 871 | Disposition: A | Discharge status: Skilled Nursing Facility | Payer: Medicare HMO | Attending: Internal Medicine | Admitting: Internal Medicine

## 2016-12-14 DIAGNOSIS — Z515 Encounter for palliative care: Secondary | ICD-10-CM | POA: Diagnosis present

## 2016-12-14 DIAGNOSIS — J9621 Acute and chronic respiratory failure with hypoxia: Secondary | ICD-10-CM | POA: Diagnosis present

## 2016-12-14 DIAGNOSIS — B9620 Unspecified Escherichia coli [E. coli] as the cause of diseases classified elsewhere: Secondary | ICD-10-CM | POA: Diagnosis present

## 2016-12-14 DIAGNOSIS — Y95 Nosocomial condition: Secondary | ICD-10-CM | POA: Diagnosis present

## 2016-12-14 DIAGNOSIS — Z79899 Other long term (current) drug therapy: Secondary | ICD-10-CM | POA: Diagnosis not present

## 2016-12-14 DIAGNOSIS — J189 Pneumonia, unspecified organism: Secondary | ICD-10-CM | POA: Diagnosis present

## 2016-12-14 DIAGNOSIS — G309 Alzheimer's disease, unspecified: Secondary | ICD-10-CM | POA: Diagnosis present

## 2016-12-14 DIAGNOSIS — Z7982 Long term (current) use of aspirin: Secondary | ICD-10-CM | POA: Diagnosis not present

## 2016-12-14 DIAGNOSIS — R4182 Altered mental status, unspecified: Secondary | ICD-10-CM

## 2016-12-14 DIAGNOSIS — N179 Acute kidney failure, unspecified: Secondary | ICD-10-CM | POA: Diagnosis present

## 2016-12-14 DIAGNOSIS — A419 Sepsis, unspecified organism: Secondary | ICD-10-CM | POA: Diagnosis not present

## 2016-12-14 DIAGNOSIS — F329 Major depressive disorder, single episode, unspecified: Secondary | ICD-10-CM | POA: Diagnosis present

## 2016-12-14 DIAGNOSIS — J441 Chronic obstructive pulmonary disease with (acute) exacerbation: Secondary | ICD-10-CM | POA: Diagnosis present

## 2016-12-14 DIAGNOSIS — F0281 Dementia in other diseases classified elsewhere with behavioral disturbance: Secondary | ICD-10-CM | POA: Diagnosis not present

## 2016-12-14 DIAGNOSIS — Z8701 Personal history of pneumonia (recurrent): Secondary | ICD-10-CM

## 2016-12-14 DIAGNOSIS — J440 Chronic obstructive pulmonary disease with acute lower respiratory infection: Secondary | ICD-10-CM | POA: Diagnosis present

## 2016-12-14 DIAGNOSIS — G9340 Encephalopathy, unspecified: Secondary | ICD-10-CM | POA: Diagnosis present

## 2016-12-14 DIAGNOSIS — E039 Hypothyroidism, unspecified: Secondary | ICD-10-CM | POA: Diagnosis present

## 2016-12-14 DIAGNOSIS — Z66 Do not resuscitate: Secondary | ICD-10-CM | POA: Diagnosis present

## 2016-12-14 DIAGNOSIS — I4891 Unspecified atrial fibrillation: Secondary | ICD-10-CM | POA: Diagnosis present

## 2016-12-14 DIAGNOSIS — M6281 Muscle weakness (generalized): Secondary | ICD-10-CM

## 2016-12-14 DIAGNOSIS — H409 Unspecified glaucoma: Secondary | ICD-10-CM | POA: Diagnosis present

## 2016-12-14 DIAGNOSIS — R262 Difficulty in walking, not elsewhere classified: Secondary | ICD-10-CM

## 2016-12-14 DIAGNOSIS — I110 Hypertensive heart disease with heart failure: Secondary | ICD-10-CM | POA: Diagnosis present

## 2016-12-14 DIAGNOSIS — R0600 Dyspnea, unspecified: Secondary | ICD-10-CM

## 2016-12-14 DIAGNOSIS — R0602 Shortness of breath: Secondary | ICD-10-CM | POA: Diagnosis present

## 2016-12-14 DIAGNOSIS — E785 Hyperlipidemia, unspecified: Secondary | ICD-10-CM | POA: Diagnosis present

## 2016-12-14 DIAGNOSIS — J209 Acute bronchitis, unspecified: Secondary | ICD-10-CM | POA: Diagnosis present

## 2016-12-14 DIAGNOSIS — I509 Heart failure, unspecified: Secondary | ICD-10-CM | POA: Diagnosis not present

## 2016-12-14 DIAGNOSIS — Z87891 Personal history of nicotine dependence: Secondary | ICD-10-CM | POA: Diagnosis not present

## 2016-12-14 DIAGNOSIS — I5033 Acute on chronic diastolic (congestive) heart failure: Secondary | ICD-10-CM | POA: Diagnosis present

## 2016-12-14 DIAGNOSIS — R1310 Dysphagia, unspecified: Secondary | ICD-10-CM

## 2016-12-14 DIAGNOSIS — N390 Urinary tract infection, site not specified: Secondary | ICD-10-CM | POA: Diagnosis present

## 2016-12-14 DIAGNOSIS — G308 Other Alzheimer's disease: Secondary | ICD-10-CM | POA: Diagnosis not present

## 2016-12-14 DIAGNOSIS — Z880 Allergy status to penicillin: Secondary | ICD-10-CM

## 2016-12-14 LAB — CBC WITH DIFFERENTIAL/PLATELET
BASOS ABS: 0 10*3/uL (ref 0–0.1)
BASOS PCT: 0 %
EOS ABS: 0 10*3/uL (ref 0–0.7)
Eosinophils Relative: 0 %
HEMATOCRIT: 36.7 % (ref 35.0–47.0)
HEMOGLOBIN: 11.7 g/dL — AB (ref 12.0–16.0)
Lymphocytes Relative: 13 %
Lymphs Abs: 1.1 10*3/uL (ref 1.0–3.6)
MCH: 30.7 pg (ref 26.0–34.0)
MCHC: 31.8 g/dL — AB (ref 32.0–36.0)
MCV: 96.3 fL (ref 80.0–100.0)
Monocytes Absolute: 0.7 10*3/uL (ref 0.2–0.9)
Monocytes Relative: 8 %
Neutro Abs: 6.7 10*3/uL — ABNORMAL HIGH (ref 1.4–6.5)
Neutrophils Relative %: 79 %
Platelets: 231 10*3/uL (ref 150–440)
RBC: 3.81 MIL/uL (ref 3.80–5.20)
RDW: 19.7 % — ABNORMAL HIGH (ref 11.5–14.5)
WBC: 8.6 10*3/uL (ref 3.6–11.0)

## 2016-12-14 LAB — URINALYSIS, COMPLETE (UACMP) WITH MICROSCOPIC
BILIRUBIN URINE: NEGATIVE
Glucose, UA: NEGATIVE mg/dL
Hgb urine dipstick: NEGATIVE
Ketones, ur: 5 mg/dL — AB
LEUKOCYTES UA: NEGATIVE
Nitrite: NEGATIVE
PH: 5 (ref 5.0–8.0)
Protein, ur: 100 mg/dL — AB
SPECIFIC GRAVITY, URINE: 1.024 (ref 1.005–1.030)

## 2016-12-14 LAB — COMPREHENSIVE METABOLIC PANEL
ALT: 27 U/L (ref 14–54)
ANION GAP: 13 (ref 5–15)
AST: 51 U/L — ABNORMAL HIGH (ref 15–41)
Albumin: 3.4 g/dL — ABNORMAL LOW (ref 3.5–5.0)
Alkaline Phosphatase: 69 U/L (ref 38–126)
BILIRUBIN TOTAL: 0.7 mg/dL (ref 0.3–1.2)
BUN: 45 mg/dL — ABNORMAL HIGH (ref 6–20)
CHLORIDE: 104 mmol/L (ref 101–111)
CO2: 20 mmol/L — ABNORMAL LOW (ref 22–32)
Calcium: 8.5 mg/dL — ABNORMAL LOW (ref 8.9–10.3)
Creatinine, Ser: 1.72 mg/dL — ABNORMAL HIGH (ref 0.44–1.00)
GFR, EST AFRICAN AMERICAN: 30 mL/min — AB (ref >60)
GFR, EST NON AFRICAN AMERICAN: 26 mL/min — AB (ref >60)
Glucose, Bld: 122 mg/dL — ABNORMAL HIGH (ref 65–99)
POTASSIUM: 4.9 mmol/L (ref 3.5–5.1)
Sodium: 137 mmol/L (ref 135–145)
TOTAL PROTEIN: 7.3 g/dL (ref 6.5–8.1)

## 2016-12-14 LAB — BLOOD GAS, ARTERIAL
ACID-BASE DEFICIT: 5 mmol/L — AB (ref 0.0–2.0)
BICARBONATE: 19.8 mmol/L — AB (ref 20.0–28.0)
FIO2: 0.28
O2 SAT: 86.8 %
PATIENT TEMPERATURE: 37
pCO2 arterial: 35 mmHg (ref 32.0–48.0)
pH, Arterial: 7.36 (ref 7.350–7.450)
pO2, Arterial: 55 mmHg — ABNORMAL LOW (ref 83.0–108.0)

## 2016-12-14 LAB — PROTIME-INR
INR: 1.39
PROTHROMBIN TIME: 17.2 s — AB (ref 11.4–15.2)

## 2016-12-14 LAB — LACTIC ACID, PLASMA
LACTIC ACID, VENOUS: 5.7 mmol/L — AB (ref 0.5–1.9)
LACTIC ACID, VENOUS: 2.9 mmol/L — AB (ref 0.5–1.9)

## 2016-12-14 LAB — MRSA PCR SCREENING: MRSA by PCR: NEGATIVE

## 2016-12-14 LAB — PROCALCITONIN: Procalcitonin: <0.1 ng/mL

## 2016-12-14 MED ORDER — VANCOMYCIN HCL IN DEXTROSE 1-5 GM/200ML-% IV SOLN
1000.0000 mg | Freq: Once | INTRAVENOUS | Status: AC
  Administered 2016-12-14: 1000 mg via INTRAVENOUS
  Filled 2016-12-14: qty 200

## 2016-12-14 MED ORDER — SENNOSIDES-DOCUSATE SODIUM 8.6-50 MG PO TABS: 1.0000 | ORAL_TABLET | Freq: Every evening | ORAL | Status: DC | PRN

## 2016-12-14 MED ORDER — BAZA PROTECT EX CREA: TOPICAL_CREAM | Freq: Two times a day (BID) | CUTANEOUS | Status: DC | PRN

## 2016-12-14 MED ORDER — SODIUM CHLORIDE 0.9 % IV BOLUS (SEPSIS)
500.0000 mL | Freq: Once | INTRAVENOUS | Status: AC
Start: 2016-12-14 — End: 2016-12-14
  Administered 2016-12-14: 500 mL via INTRAVENOUS

## 2016-12-14 MED ORDER — SODIUM CHLORIDE 0.9 % IV SOLN: 1000.0000 mL | Freq: Once | INTRAVENOUS | Status: DC

## 2016-12-14 MED ORDER — LORAZEPAM 0.5 MG PO TABS: 0.5000 mg | ORAL_TABLET | Freq: Three times a day (TID) | ORAL | Status: DC | PRN

## 2016-12-14 MED ORDER — FLUTICASONE PROPIONATE 50 MCG/ACT NA SUSP
1.0000 | Freq: Two times a day (BID) | NASAL | Status: DC
  Administered 2016-12-14 – 2016-12-20 (×11): 1 via NASAL
  Filled 2016-12-14: qty 16

## 2016-12-14 MED ORDER — DIVALPROEX SODIUM 250 MG PO DR TAB
250.0000 mg | DELAYED_RELEASE_TABLET | Freq: Two times a day (BID) | ORAL | Status: DC
  Administered 2016-12-14 – 2016-12-19 (×8): 250 mg via ORAL
  Filled 2016-12-14 (×10): qty 1

## 2016-12-14 MED ORDER — IPRATROPIUM-ALBUTEROL 0.5-2.5 (3) MG/3ML IN SOLN
3.0000 mL | Freq: Four times a day (QID) | RESPIRATORY_TRACT | Status: DC
  Administered 2016-12-14 – 2016-12-17 (×12): 3 mL via RESPIRATORY_TRACT
  Filled 2016-12-14 (×13): qty 3

## 2016-12-14 MED ORDER — LEVOTHYROXINE SODIUM 100 MCG PO TABS
100.0000 ug | ORAL_TABLET | Freq: Every day | ORAL | Status: DC
  Administered 2016-12-15 – 2016-12-20 (×4): 100 ug via ORAL
  Filled 2016-12-14 (×5): qty 1

## 2016-12-14 MED ORDER — PIPERACILLIN-TAZOBACTAM 3.375 G IVPB
3.3750 g | Freq: Three times a day (TID) | INTRAVENOUS | Status: DC
  Administered 2016-12-14 – 2016-12-16 (×5): 3.375 g via INTRAVENOUS
  Filled 2016-12-14 (×5): qty 50

## 2016-12-14 MED ORDER — PIPERACILLIN-TAZOBACTAM 3.375 G IVPB 30 MIN
3.3750 g | Freq: Once | INTRAVENOUS | Status: AC
  Administered 2016-12-14: 3.375 g via INTRAVENOUS
  Filled 2016-12-14: qty 50

## 2016-12-14 MED ORDER — AZELASTINE HCL 0.1 % NA SOLN
1.0000 | Freq: Two times a day (BID) | NASAL | Status: DC
  Administered 2016-12-14 – 2016-12-20 (×11): 1 via NASAL
  Filled 2016-12-14: qty 30

## 2016-12-14 MED ORDER — ESCITALOPRAM OXALATE 10 MG PO TABS
20.0000 mg | ORAL_TABLET | Freq: Every day | ORAL | Status: DC
  Administered 2016-12-15 – 2016-12-20 (×5): 20 mg via ORAL
  Filled 2016-12-14 (×6): qty 2

## 2016-12-14 MED ORDER — VANCOMYCIN HCL IN DEXTROSE 750-5 MG/150ML-% IV SOLN
750.0000 mg | INTRAVENOUS | Status: DC
  Administered 2016-12-15: 750 mg via INTRAVENOUS
  Filled 2016-12-14: qty 150

## 2016-12-14 MED ORDER — AZELASTINE-FLUTICASONE 137-50 MCG/ACT NA SUSP: 2.0000 | Freq: Two times a day (BID) | NASAL | Status: DC

## 2016-12-14 MED ORDER — SODIUM CHLORIDE 0.9 % IV SOLN
INTRAVENOUS | Status: DC
  Administered 2016-12-14 – 2016-12-16 (×2): via INTRAVENOUS
  Administered 2016-12-17: 500 mL via INTRAVENOUS

## 2016-12-14 MED ORDER — SODIUM CHLORIDE 0.9 % IV BOLUS (SEPSIS)
500.0000 mL | Freq: Once | INTRAVENOUS | Status: AC
  Administered 2016-12-14: 500 mL via INTRAVENOUS

## 2016-12-14 MED ORDER — FUROSEMIDE 10 MG/ML IJ SOLN
20.0000 mg | Freq: Once | INTRAMUSCULAR | Status: AC
  Administered 2016-12-14: 21:00:00 20 mg via INTRAVENOUS
  Filled 2016-12-14: qty 2

## 2016-12-14 MED ORDER — BISACODYL 10 MG RE SUPP: 10.0000 mg | Freq: Every day | RECTAL | Status: DC | PRN

## 2016-12-14 MED ORDER — HYDROCODONE-ACETAMINOPHEN 5-325 MG PO TABS
1.0000 | ORAL_TABLET | ORAL | Status: DC | PRN
  Administered 2016-12-22: 1 via ORAL
  Filled 2016-12-14: qty 1

## 2016-12-14 MED ORDER — SODIUM CHLORIDE 0.9% FLUSH
3.0000 mL | Freq: Two times a day (BID) | INTRAVENOUS | Status: DC
  Administered 2016-12-14 – 2016-12-21 (×10): 3 mL via INTRAVENOUS

## 2016-12-14 MED ORDER — ACETAMINOPHEN 650 MG RE SUPP: 650.0000 mg | Freq: Four times a day (QID) | RECTAL | Status: DC | PRN

## 2016-12-14 MED ORDER — FUROSEMIDE 20 MG PO TABS
10.0000 mg | ORAL_TABLET | Freq: Every day | ORAL | Status: DC
  Administered 2016-12-15 – 2016-12-17 (×3): 10 mg via ORAL
  Filled 2016-12-14 (×4): qty 1

## 2016-12-14 MED ORDER — MEMANTINE HCL 10 MG PO TABS
10.0000 mg | ORAL_TABLET | Freq: Two times a day (BID) | ORAL | Status: DC
  Administered 2016-12-14 – 2016-12-20 (×9): 10 mg via ORAL
  Filled 2016-12-14 (×11): qty 1

## 2016-12-14 MED ORDER — TIMOLOL MALEATE 0.5 % OP SOLN
1.0000 [drp] | Freq: Two times a day (BID) | OPHTHALMIC | Status: DC
  Administered 2016-12-14 – 2016-12-20 (×11): 1 [drp] via OPHTHALMIC
  Filled 2016-12-14: qty 5

## 2016-12-14 MED ORDER — CRANBERRY 450 MG PO CAPS: 1.0000 | ORAL_CAPSULE | Freq: Two times a day (BID) | ORAL | Status: DC

## 2016-12-14 MED ORDER — PRAVASTATIN SODIUM 20 MG PO TABS
20.0000 mg | ORAL_TABLET | Freq: Every day | ORAL | Status: DC
  Administered 2016-12-14 – 2016-12-17 (×4): 20 mg via ORAL
  Filled 2016-12-14 (×5): qty 1

## 2016-12-14 MED ORDER — ENOXAPARIN SODIUM 30 MG/0.3ML ~~LOC~~ SOLN
30.0000 mg | SUBCUTANEOUS | Status: DC
  Administered 2016-12-14 – 2016-12-17 (×4): 30 mg via SUBCUTANEOUS
  Filled 2016-12-14 (×4): qty 0.3

## 2016-12-14 MED ORDER — ACETAMINOPHEN 500 MG PO TABS
1000.0000 mg | ORAL_TABLET | Freq: Every day | ORAL | Status: DC
Start: 2016-12-14 — End: 2016-12-23
  Administered 2016-12-14 – 2016-12-22 (×7): 1000 mg via ORAL
  Filled 2016-12-14 (×8): qty 2

## 2016-12-14 MED ORDER — ZINC OXIDE 40 % EX OINT
TOPICAL_OINTMENT | Freq: Two times a day (BID) | CUTANEOUS | Status: DC | PRN
  Filled 2016-12-14: qty 114

## 2016-12-14 MED ORDER — ASPIRIN 325 MG PO TABS
325.0000 mg | ORAL_TABLET | Freq: Every day | ORAL | Status: DC
  Administered 2016-12-14 – 2016-12-20 (×6): 325 mg via ORAL
  Filled 2016-12-14 (×8): qty 1

## 2016-12-14 MED ORDER — ACETAMINOPHEN 325 MG PO TABS
650.0000 mg | ORAL_TABLET | Freq: Four times a day (QID) | ORAL | Status: DC | PRN
  Administered 2016-12-15 – 2016-12-22 (×2): 650 mg via ORAL
  Filled 2016-12-14 (×2): qty 2

## 2016-12-14 NOTE — ED Triage Notes (Signed)
Pt c/o AMS, arrives with daughter from [facility]. Dx with UTI x3 days and has been taking antibiotics as directed. Wet cough present. Hx CHF.

## 2016-12-14 NOTE — H&P (Signed)
Family Meeting Note  Advance Directive:yes  Today a meeting took place with the POA, daughter Paula HurterJanice Summers.  Patient is unable to participate due UJ:[id number]:Lacked capacity severe dementia   The following clinical team members were present during this meeting:MD  The following were discussed:Patient's diagnosis SEPSIS HCAP Hypoxic respiratory failure  , Patient's progosis: Unable to determine and Goals for treatment: DNR  Additional follow-up to be provided: none  Time spent during discussion:16 minutes  Benedetto Ryder, MD

## 2016-12-14 NOTE — ED Notes (Signed)
Unable to take oral or axillary temp.

## 2016-12-14 NOTE — ED Provider Notes (Signed)
[facility] Emergency Department Provider Note   ____________________________________________    I have reviewed the triage vital signs and the nursing notes.   HISTORY  Chief Complaint Altered Mental Status  History significantly limited by Alzheimer dementia   HPI Paula Summers is a 83 y.o. female with a history as listed below who presents with worsening mental status. Reportedly patient is been treated for urinary tract infection over the last 3 days but she has continued to become less responsive. No fevers are reported. No vomiting.   Past Medical History:  Diagnosis Date  . Alzheimer disease   . Atrial fibrillation (HCC)   . Breast screening, unspecified   . Carotid stenosis   . CHF (congestive heart failure) (HCC)   . Depression   . Glaucoma   . Hyperlipidemia   . Hypertension   . Hypothyroidism   . Personal history of tobacco use, presenting hazards to health   . Special screening for malignant neoplasms, colon     Patient Active Problem List   Diagnosis Date Noted  . Recurrent UTI 02/17/2016  . A-fib (HCC) 02/16/2016  . Clinical depression 02/16/2016  . Dementia 02/16/2016  . Hypercholesteremia 02/16/2016  . Temporary cerebral vascular dysfunction 02/16/2016  . Excessive falling 02/16/2016  . Gait instability 02/16/2016  . Carotid stenosis   . Cholelithiasis without obstruction 05/14/2007  . Adult hypothyroidism 09/08/2006  . Allergic rhinitis 02/12/2006  . Colon, diverticulosis 02/12/2006  . Generalized OA 02/12/2006  . Glaucoma, compensated 02/12/2006  . BP (high blood pressure) 02/12/2006    Past Surgical History:  Procedure Laterality Date  . APPENDECTOMY  1940  . BACK SURGERY  [phone]  . CAROTID ENDARTERECTOMY Left 2010  . COLONOSCOPY  2006   [place]  . TONSILLECTOMY  1940    Prior to Admission medications   Medication Sig Start Date End Date Taking? Authorizing Provider  acetaminophen (TYLENOL)  325 MG tablet Take 650 mg by mouth daily.    Historical Provider, MD  acetaminophen (TYLENOL) 500 MG tablet Take 500 mg by mouth at bedtime.     Historical Provider, MD  aspirin 325 MG tablet Take 1 tablet (325 mg total) by mouth daily. 12/13/16   Jodell Cipro Chrismon, PA  Azelastine-Fluticasone 137-50 MCG/ACT SUSP Place into the nose.    Historical Provider, MD  ciprofloxacin (CIPRO) 500 MG tablet Take 1 tablet (500 mg total) by mouth 2 (two) times daily. 12/11/16   Malva Limes, MD  Cranberry 450 MG CAPS Take 1 capsule by mouth 2 (two) times daily.    Historical Provider, MD  escitalopram (LEXAPRO) 20 MG tablet Take 20 mg by mouth daily.    Historical Provider, MD  levothyroxine (SYNTHROID, LEVOTHROID) 100 MCG tablet  02/19/16   Historical Provider, MD  LORazepam (ATIVAN) 0.5 MG tablet Take 1 tablet (0.5 mg total) by mouth every 8 (eight) hours as needed for anxiety. 09/01/16 09/01/17  Governor Rooks, MD  metoprolol succinate (TOPROL-XL) 25 MG 24 hr tablet Take 1 tablet (25 mg total) by mouth daily. 12/13/16   Jodell Cipro Chrismon, PA  potassium chloride (K-DUR) 10 MEQ tablet Take 1 tablet (10 mEq total) by mouth daily. 12/13/16   Jodell Cipro Chrismon, PA  pravastatin (PRAVACHOL) 20 MG tablet Take 1 tablet (20 mg total) by mouth daily. 12/13/16   Jodell Cipro Chrismon, PA  Skin Protectants, Misc. (DIMETHICONE-ZINC OXIDE) cream Apply topically 2 (two) times daily as needed (to peri area).    Historical Provider, MD  timolol (TIMOPTIC) 0.5 % ophthalmic solution Place 1 drop into both eyes 2 (two) times daily.     Historical Provider, MD     Allergies Penicillins  Family History  Problem Relation Age of Onset  . Bladder Cancer Neg Hx   . Prostate cancer Neg Hx   . Kidney cancer Neg Hx     Social History Social History  Substance Use Topics  . Smoking status: Former Smoker    Packs/day: 1.00    Years: 20.00    Types: Cigarettes  . Smokeless tobacco: Never Used  . Alcohol use No    Level 5 caveat: Unable to  obtain Review of Systems due to dementia   ____________________________________________   PHYSICAL EXAM:  VITAL SIGNS: ED Triage Vitals  Enc Vitals Group     BP 12/14/16 0953 (!) 100/57     Pulse Rate 12/14/16 0953 (!) 107     Resp 12/14/16 0953 20     Temp 12/14/16 1025 97.1 F (36.2 C)     Temp Source 12/14/16 1025 Rectal     SpO2 12/14/16 0953 92 %     Weight 12/14/16 0954 140 lb (63.5 kg)     Height --      Head Circumference --      Peak Flow --      Pain Score --      Pain Loc --      Pain Edu? --      Excl. in GC? --     Constitutional:Opens her eyes to questions No acute distress.  Eyes: Conjunctivae are normal.  Head: Atraumatic. Nose: No congestion/rhinnorhea.  Neck:  Painless ROM Cardiovascular: Tachycardia regular rhythm. Grossly normal heart sounds.  Good peripheral circulation. Respiratory: Normal respiratory effort.  No retractions. Bibasilar rales Gastrointestinal: Soft and nontender. No distention.  No CVA tenderness. Genitourinary: deferred Musculoskeletal:  Warm and well perfused Neurologic:   No gross focal neurologic deficits are appreciated.  Skin:  Skin is warm, dry and intact. No rash noted.   ____________________________________________   LABS (all labs ordered are listed, but only abnormal results are displayed)  Labs Reviewed  COMPREHENSIVE METABOLIC PANEL - Abnormal; Notable for the following:       Result Value   CO2 20 (*)    Glucose, Bld 122 (*)    BUN 45 (*)    Creatinine, Ser 1.72 (*)    Calcium 8.5 (*)    Albumin 3.4 (*)    AST 51 (*)    GFR calc non Af Amer 26 (*)    GFR calc Af Amer 30 (*)    All other components within normal limits  LACTIC ACID, PLASMA - Abnormal; Notable for the following:    Lactic Acid, Venous 5.7 (*)    All other components within normal limits  CBC WITH DIFFERENTIAL/PLATELET - Abnormal; Notable for the following:    Hemoglobin 11.7 (*)    MCHC 31.8 (*)    RDW 19.7 (*)    Neutro Abs 6.7 (*)     All other components within normal limits  PROTIME-INR - Abnormal; Notable for the following:    Prothrombin Time 17.2 (*)    All other components within normal limits  URINALYSIS, COMPLETE (UACMP) WITH MICROSCOPIC - Abnormal; Notable for the following:    Color, Urine AMBER (*)    APPearance HAZY (*)    Ketones, ur 5 (*)    Protein, ur 100 (*)    Bacteria, UA MANY (*)  Squamous Epithelial / LPF 0-5 (*)    All other components within normal limits  CULTURE, BLOOD (ROUTINE X 2)  CULTURE, BLOOD (ROUTINE X 2)  URINE CULTURE  LACTIC ACID, PLASMA   ____________________________________________  EKG  None ____________________________________________  RADIOLOGY  Interstitial edema versus pneumonia ____________________________________________   PROCEDURES  Procedure(s) performed: No    Critical Care performed:No ____________________________________________   INITIAL IMPRESSION / ASSESSMENT AND PLAN / ED COURSE  Pertinent labs & imaging results that were available during my care of the patient were reviewed by me and considered in my medical decision making (see chart for details).  Patient presents with worsening mental status, strong suspicion for infectious etiology. We'll check labs, chest x-ray, urine and reevaluate  Clinical Course   Chest x-ray is concerning for interstitial edema versus possible pneumonia. Lab work is significant primarily for significant elevated lactate. We will treat with IV antibiotics and admitted to the hospitalist service for further workup ____________________________________________   FINAL CLINICAL IMPRESSION(S) / ED DIAGNOSES  Final diagnoses:  Altered mental status, unspecified altered mental status type  Sepsis, due to unspecified organism [facility](HCC)      NEW MEDICATIONS STARTED DURING THIS VISIT:  New Prescriptions   No medications on file     Note:  This document was prepared using Dragon voice recognition software and  may include unintentional dictation errors.    Jene Everyobert Trenisha Lafavor, MD 12/14/16 1218

## 2016-12-14 NOTE — Progress Notes (Signed)
Anticoagulation monitoring(Lovenox):  83yo  F ordered Lovenox 40 mg Q24h  Filed Weights   12/14/16 0954 12/14/16 1514  Weight: 140 lb (63.5 kg) 140 lb 7 oz (63.7 kg)     Lab Results  Component Value Date   CREATININE 1.72 (H) 12/14/2016   CREATININE 0.97 09/01/2016   CREATININE 0.86 12/18/2015   Estimated Creatinine Clearance: 21.4 mL/min (by C-G formula based on SCr of 1.72 mg/dL (H)). Hemoglobin & Hematocrit     Component Value Date/Time   HGB 11.7 (L) 12/14/2016 1008   HGB 12.6 04/29/2014 1929   HCT 36.7 12/14/2016 1008   HCT 39.4 04/29/2014 1929     Per Protocol for Patient with estCrcl < 30 ml/min and BMI < 40, will transition to Lovenox 30 mg Q24h.     Paula HartScott Savahna Summers, PharmD Clinical Pharmacist

## 2016-12-14 NOTE — Consult Note (Addendum)
Pharmacy Antibiotic Note  Paula Summers is a 83 y.o. female admitted on 12/14/2016 with sepsis and possible PNA.  Pharmacy has been consulted for zosyn and vancomycin dosing. Plan: Pt received 1g of vancomycin in the ER. Will give next dose in 12 hr for stacked dosing Vancomycin 750 IV every 36 hours.  Goal trough 15-20 mcg/mL. Zosyn 3.375g IV q8h (4 hour infusion).  Pt was treated outpatient for a UTI, pt UA today does not show infection (pt is colonized) appears pt does fall into PCT parameters. Will order PCT, MRSA PCR ordered. Trough prior to the 5th dose 1130 on 1/11 Pt has a PCN allergy (hives). She was given zosyn in ER. Per Dr. Juliene PinaMody, pt has not had a reaction yet. Wants to continue with zosyn order. Floor RN was called to let her know to look for any reaction, to inform pharmacy prior to next dose if reaction were to occur.   Weight: 140 lb 7 oz (63.7 kg)  Temp (24hrs), Avg:97.3 F (36.3 C), Min:97.1 F (36.2 C), Max:97.5 F (36.4 C)   Recent Labs Lab 12/14/16 1008 12/14/16 1009 12/14/16 1247  WBC 8.6  --   --   CREATININE 1.72*  --   --   LATICACIDVEN  --  5.7* 2.9*    Estimated Creatinine Clearance: 21.4 mL/min (by C-G formula based on SCr of 1.72 mg/dL (H)).    Allergies  Allergen Reactions  . Penicillins Hives    Antimicrobials this admission: vancomycin 1/6 >>  zosyn 1/6 >>   Dose adjustments this admission:   Microbiology results: 1/6 BCx:  1/6 UCx:   1/6: PCT 1/6 MRSA PCR:   Thank you for allowing pharmacy to be a part of this patient's care.  Olene FlossMelissa D Solomia Harrell, Pharm.D, BCPS Clinical Pharmacist  12/14/2016 5:47 PM

## 2016-12-14 NOTE — H&P (Signed)
[facility] [place] at [facility]   PATIENT NAME: Paula Summers    MR#:  [id number]  DATE OF BIRTH:  01-Nov-1931  DATE OF ADMISSION:  12/14/2016  PRIMARY CARE PHYSICIAN: Dr Dossie Arbourrissman   REQUESTING/REFERRING PHYSICIAN: dr Cyril Loosenkinner  CHIEF COMPLAINT:   Altered mental status HISTORY OF PRESENT ILLNESS:  Paula Summers  is a 83 y.o. female with a known history of Severe dementia, atrial fibrillation and hypothyroidism who [facility] with her daughter for above complaint. Patient was seen by neurologist on Tuesday for routine follow-up and the daughter was concerned for UTI so urine was checked and she was diagnosed with UTI. She was prescribed ciprofloxacin which she has been taking since Wednesday. This morning the group home called the daughter due to patient being lethargic, having chills, and worsening confusion on baseline severe dementia. In the emergency room chest x-ray shows possible pneumonia and urine analysis does not show evidence of UTI. Her blood pressure systolic has been in the 80s to 90s. She has received IV fluids. Her daughter reports a history of CHF and atrial fibrillation but there is no documentation of echocardiogram or note from her cardiologist in the patient's chart.    PAST MEDICAL HISTORY:   Past Medical History:  Diagnosis Date  . Alzheimer disease   . Atrial fibrillation (HCC)   . Breast screening, unspecified   . Carotid stenosis   . CHF (congestive heart failure) (HCC)   . Depression   . Glaucoma   . Hyperlipidemia   . Hypertension   . Hypothyroidism   . Personal history of tobacco use, presenting hazards to health   . Special screening for malignant neoplasms, colon     PAST SURGICAL HISTORY:   Past Surgical History:  Procedure Laterality Date  . APPENDECTOMY  1940  . BACK SURGERY  [phone]  . CAROTID ENDARTERECTOMY Left 2010  . COLONOSCOPY  2006   [place]  . TONSILLECTOMY  1940     SOCIAL HISTORY:   Social History  Substance Use Topics  . Smoking status: Former Smoker    Packs/day: 1.00    Years: 20.00    Types: Cigarettes  . Smokeless tobacco: Never Used  . Alcohol use No    FAMILY HISTORY:   Family History  Problem Relation Age of Onset  . Bladder Cancer Neg Hx   . Prostate cancer Neg Hx   . Kidney cancer Neg Hx     DRUG ALLERGIES:   Allergies  Allergen Reactions  . Penicillins Hives    REVIEW OF SYSTEMS:   Review of Systems  Unable to perform ROS: Dementia    MEDICATIONS AT HOME:   Prior to Admission medications   Medication Sig Start Date End Date Taking? Authorizing Provider  acetaminophen (TYLENOL) 500 MG tablet Take 1,000 mg by mouth at bedtime.    Yes Historical Provider, MD  aspirin 325 MG tablet Take 1 tablet (325 mg total) by mouth daily. 12/13/16  Yes Dennis E Chrismon, PA  Azelastine-Fluticasone 137-50 MCG/ACT SUSP Place 2 sprays into the nose 2 (two) times daily.    Yes Historical Provider, MD  ciprofloxacin (CIPRO) 500 MG tablet Take 1 tablet (500 mg total) by mouth 2 (two) times daily. 12/11/16  Yes Malva Limesonald E Fisher, MD  Cranberry 450 MG CAPS Take 1 capsule by mouth 2 (two) times daily.   Yes Historical Provider, MD  divalproex (DEPAKOTE) 250 MG DR tablet Take 250 mg by mouth 2 (two)  times daily.   Yes Historical Provider, MD  escitalopram (LEXAPRO) 20 MG tablet Take 20 mg by mouth daily.   Yes Historical Provider, MD  furosemide (LASIX) 20 MG tablet Take 10 mg by mouth daily.   Yes Historical Provider, MD  guaiFENesin (MUCINEX) 600 MG 12 hr tablet Take 600 mg by mouth 2 (two) times daily as needed.   Yes Historical Provider, MD  levothyroxine (SYNTHROID, LEVOTHROID) 100 MCG tablet Take 100 mcg by mouth daily.  02/19/16  Yes Historical Provider, MD  LORazepam (ATIVAN) 0.5 MG tablet Take 1 tablet (0.5 mg total) by mouth every 8 (eight) hours as needed for anxiety. 09/01/16 09/01/17 Yes Governor Rooks, MD  memantine (NAMENDA) 10 MG  tablet Take 10 mg by mouth 2 (two) times daily.   Yes Historical Provider, MD  metoprolol succinate (TOPROL-XL) 25 MG 24 hr tablet Take 1 tablet (25 mg total) by mouth daily. 12/13/16  Yes Dennis E Chrismon, PA  potassium chloride (K-DUR) 10 MEQ tablet Take 1 tablet (10 mEq total) by mouth daily. 12/13/16  Yes Dennis E Chrismon, PA  pravastatin (PRAVACHOL) 20 MG tablet Take 1 tablet (20 mg total) by mouth daily. 12/13/16  Yes Jodell Cipro Chrismon, PA  Skin Protectants, Misc. (DIMETHICONE-ZINC OXIDE) cream Apply topically 2 (two) times daily as needed (to peri area). To peri area   Yes Historical Provider, MD  timolol (TIMOPTIC) 0.5 % ophthalmic solution Place 1 drop into both eyes 2 (two) times daily.    Yes Historical Provider, MD      VITAL SIGNS:  Blood pressure (!) 87/59, pulse (!) 107, temperature 97.1 F (36.2 C), temperature source Rectal, resp. rate 15, weight 63.5 kg (140 lb), SpO2 97 %.  PHYSICAL EXAMINATION:   Physical Exam  Constitutional: She is well-developed, well-nourished, and in no distress. No distress.  HENT:  Head: Normocephalic.  Eyes: No scleral icterus.  Neck: Normal range of motion. Neck supple. No JVD present. No tracheal deviation present.  Cardiovascular: Normal rate and regular rhythm.  Exam reveals no gallop and no friction rub.   Murmur heard. Irregular, irregular tachycardic  Pulmonary/Chest: Effort normal and breath sounds normal. No respiratory distress. She has no wheezes. She has no rales. She exhibits no tenderness.  Abdominal: Soft. Bowel sounds are normal. She exhibits no distension and no mass. There is no tenderness. There is no rebound and no guarding.  Musculoskeletal: Normal range of motion. She exhibits edema and tenderness. She exhibits no deformity.  Neurological: She is alert.  Skin: Skin is warm. No rash noted. No erythema.  Some bruising on arms      LABORATORY PANEL:   CBC  Recent Labs Lab 12/14/16 1008  WBC 8.6  HGB 11.7*  HCT 36.7   PLT 231   ------------------------------------------------------------------------------------------------------------------  Chemistries   Recent Labs Lab 12/14/16 1008  NA 137  K 4.9  CL 104  CO2 20*  GLUCOSE 122*  BUN 45*  CREATININE 1.72*  CALCIUM 8.5*  AST 51*  ALT 27  ALKPHOS 69  BILITOT 0.7   ------------------------------------------------------------------------------------------------------------------  Cardiac Enzymes No results for input(s): TROPONINI in the last 168 hours. ------------------------------------------------------------------------------------------------------------------  RADIOLOGY:  Dg Chest Port 1 View  Result Date: 12/14/2016 CLINICAL DATA:  Altered mental status. History of atrial fibrillation. EXAM: PORTABLE CHEST 1 VIEW COMPARISON:  PA and lateral chest 12/22/2013 and 07/22/2014. FINDINGS: There is cardiomegaly and interstitial prominence, left worse than right. No pneumothorax or pleural effusion. Aortic atherosclerosis is noted. Scoliosis is identified. IMPRESSION: Cardiomegaly in  asymmetric interstitial opacities have an appearance most suggestive of interstitial edema. Atherosclerosis. Electronically Signed   By: Drusilla Kanner M.D.   On: 12/14/2016 10:37    EKG:     IMPRESSION AND PLAN:   83 year old female with severe Alzheimer's dementia who presents with acute encephalopathy and acute hypoxic respiratory failure with sepsis.  1. Acute hypoxia respiratory failure due to HCAP:  Continue vancomycin and Zosyn Repeat chest x-ray in a.m. Wean oxygen as tolerated Speech consult for risk of aspiration   2. Sepsis with hypotension: Patient presents with hypotension and tachycardia in the setting of pneumonia. Continue broad-spectrum and about except follow up on urine and blood culture Continue IV fluids Repeat lactic acid  3. Acute encephalopathy in the setting of problem #1 and 2 and severe Alzheimer's dementia: Try to keep  lights on during the day and off at night Continue to reorient patient Avoid benzodiazepines if possible  4. Acute kidney injury: Hold nephrotoxic agents Continue IV fluids Repeat BMP in a.m.  5. History of atrial fibrillation: We will need to discontinue metoprolol for now due to hypotension Follow heart rate May add digoxin or amiodarone if heart rate consistently above 120 Check TSH   6. History of CHF: Order echocardiogram to evaluate ejection fraction  All the records are reviewed and case discussed with ED provider. Management plans discussed with the patient's daughter and she is in agreement  CODE STATUS: DNR  TOTAL TIME TAKING CARE OF THIS PATIENT: 45 minutes.    Sumayya Muha M.D on 12/14/2016 at 12:32 PM  Between 7am to 6pm - Pager - [phone]  After 6pm go to [email or web address] - Social research officer, government  [facility]  Office  [phone]  CC: Primary care physician; No PCP Per Patient

## 2016-12-14 NOTE — ED Notes (Signed)
Spoke with Dr Juliene PinaMody and informed her that patients blood pressure is 84/70 with a MAP of 77 and heart rate of 100, Dr. Juliene PinaMody informed patient is currently getting 500 mL of fluid that was ordered by Dr. Cyril LoosenKinner.  Per Dr. Juliene PinaMody, give patient additional 500 mL of fluid and will see how blood pressure is after fluid.

## 2016-12-15 ENCOUNTER — Inpatient Hospital Stay: Payer: Medicare HMO

## 2016-12-15 ENCOUNTER — Inpatient Hospital Stay
Admit: 2016-12-15 | Discharge: 2016-12-15 | Disposition: A | Discharge status: Home / Self Care | Payer: Medicare HMO | Attending: Internal Medicine | Admitting: Internal Medicine

## 2016-12-15 LAB — BASIC METABOLIC PANEL
Anion gap: 11 (ref 5–15)
BUN: 51 mg/dL — ABNORMAL HIGH (ref 6–20)
CHLORIDE: 108 mmol/L (ref 101–111)
CO2: 21 mmol/L — AB (ref 22–32)
CREATININE: 1.61 mg/dL — AB (ref 0.44–1.00)
Calcium: 7.8 mg/dL — ABNORMAL LOW (ref 8.9–10.3)
GFR calc non Af Amer: 28 mL/min — ABNORMAL LOW (ref >60)
GFR, EST AFRICAN AMERICAN: 33 mL/min — AB (ref >60)
GLUCOSE: 89 mg/dL (ref 65–99)
Potassium: 3.8 mmol/L (ref 3.5–5.1)
Sodium: 140 mmol/L (ref 135–145)

## 2016-12-15 LAB — CBC
HCT: 30.6 % — ABNORMAL LOW (ref 35.0–47.0)
Hemoglobin: 10.1 g/dL — ABNORMAL LOW (ref 12.0–16.0)
MCH: 30.8 pg (ref 26.0–34.0)
MCHC: 32.9 g/dL (ref 32.0–36.0)
MCV: 93.6 fL (ref 80.0–100.0)
PLATELETS: 192 10*3/uL (ref 150–440)
RBC: 3.27 MIL/uL — AB (ref 3.80–5.20)
RDW: 19.7 % — ABNORMAL HIGH (ref 11.5–14.5)
WBC: 7.6 10*3/uL (ref 3.6–11.0)

## 2016-12-15 MED ORDER — BENZONATATE 100 MG PO CAPS
100.0000 mg | ORAL_CAPSULE | Freq: Three times a day (TID) | ORAL | Status: DC | PRN
  Administered 2016-12-16: 04:00:00 100 mg via ORAL
  Filled 2016-12-15: qty 1

## 2016-12-15 MED ORDER — GUAIFENESIN ER 600 MG PO TB12
600.0000 mg | ORAL_TABLET | Freq: Two times a day (BID) | ORAL | Status: DC
  Administered 2016-12-15 – 2016-12-19 (×7): 600 mg via ORAL
  Filled 2016-12-15 (×9): qty 1

## 2016-12-15 MED ORDER — AZITHROMYCIN 500 MG IV SOLR
250.0000 mg | INTRAVENOUS | Status: DC
  Administered 2016-12-15: 11:00:00 250 mg via INTRAVENOUS
  Filled 2016-12-15 (×2): qty 250

## 2016-12-15 NOTE — Plan of Care (Signed)
Problem: Physical Regulation: Goal: Will remain free from infection Outcome: Progressing Pt receiving IV antibiotics  Problem: Activity: Goal: Risk for activity intolerance will decrease Outcome: Not Progressing Pt not able to get out of bed at this time.

## 2016-12-15 NOTE — Progress Notes (Signed)
[facility] - [place] at [facility]   PATIENT NAME: Paula Summers    MR#:  [id number]  DATE OF BIRTH:  1931/10/08  SUBJECTIVE:   Came in with family group home with fever or chills not feeling well. Have pneumonia Patient pleasantly confused. She is trying to feed herself breakfast this morning REVIEW OF SYSTEMS:   Review of Systems  Unable to perform ROS: Dementia   Tolerating Diet:yes Tolerating PT: Pending  DRUG ALLERGIES:   Allergies  Allergen Reactions  . Penicillins Hives    VITALS:  Blood pressure 100/86, pulse 88, temperature 98 F (36.7 C), temperature source Oral, resp. rate 16, height 5\' 1"  (1.549 m), weight 66.1 kg (145 lb 11.2 oz), SpO2 97 %.  PHYSICAL EXAMINATION:   Physical Exam  GENERAL:  83 y.o.-year-old patient lying in the bed with no acute distress.  EYES: Pupils equal, round, reactive to light and accommodation. No scleral icterus. Extraocular muscles intact.  HEENT: Head atraumatic, normocephalic. Oropharynx and nasopharynx clear.  NECK:  Supple, no jugular venous distention. No thyroid enlargement, no tenderness.  LUNGS: Normal breath sounds bilaterally, no wheezing, rales, rhonchi. No use of accessory muscles of respiration.  CARDIOVASCULAR: S1, S2 normal. No murmurs, rubs, or gallops.  ABDOMEN: Soft, nontender, nondistended. Bowel sounds present. No organomegaly or mass.  EXTREMITIES: No cyanosis, clubbing or edema b/l.    NEUROLOGIC: Grossly nonfocal unable to assess secondary to dementia PSYCHIATRIC:  patient is alert  SKIN: No obvious rash, lesion, or ulcer.   LABORATORY PANEL:  CBC  Recent Labs Lab 12/15/16 0456  WBC 7.6  HGB 10.1*  HCT 30.6*  PLT 192    Chemistries   Recent Labs Lab 12/14/16 1008 12/15/16 0456  NA 137 140  K 4.9 3.8  CL 104 108  CO2 20* 21*  GLUCOSE 122* 89  BUN 45* 51*  CREATININE 1.72* 1.61*  CALCIUM 8.5* 7.8*  AST 51*  --   ALT 27  --   ALKPHOS 69  --   BILITOT 0.7   --    Cardiac Enzymes No results for input(s): TROPONINI in the last 168 hours. RADIOLOGY:  Dg Chest 1 View  Result Date: 12/15/2016 CLINICAL DATA:  83 year old female with history of pneumonia. Alzheimer's disease. EXAM: CHEST 1 VIEW COMPARISON:  Chest x-ray 12/14/2016. FINDINGS: Lung volumes are low. Diffuse interstitial prominence most evident throughout the mid to lower lungs bilaterally which appears to be chronic and slowly progressively worsening over the past several years. Diffuse peribronchial cuffing. No confluent consolidative airspace disease. No pleural effusions. No evidence of pulmonary edema. Heart size is normal. Upper mediastinal contours are within normal limits. Aortic atherosclerosis. IMPRESSION: 1. The appearance of the lungs is most suggestive of a slowly progressive interstitial lung disease and could be best evaluated with followup nonemergent high-resolution chest CT in the near future if clinically appropriate. The possibility of superimposed acute bronchitis is not excluded, but is not strongly favored. 2. Aortic atherosclerosis. Electronically Signed   By: Trudie Reed M.D.   On: 12/15/2016 08:00   Dg Chest Port 1 View  Result Date: 12/14/2016 CLINICAL DATA:  Altered mental status. History of atrial fibrillation. EXAM: PORTABLE CHEST 1 VIEW COMPARISON:  PA and lateral chest 12/22/2013 and 07/22/2014. FINDINGS: There is cardiomegaly and interstitial prominence, left worse than right. No pneumothorax or pleural effusion. Aortic atherosclerosis is noted. Scoliosis is identified. IMPRESSION: Cardiomegaly in asymmetric interstitial opacities have an appearance most suggestive of interstitial edema. Atherosclerosis. Electronically Signed  By: Drusilla Kannerhomas  Dalessio M.D.   On: 12/14/2016 10:37   ASSESSMENT AND PLAN:  83 year old female with severe Alzheimer's dementia who presents with acute encephalopathy and acute hypoxic respiratory failure with sepsis.  1. Acute hypoxia  respiratory failure due to HCAP:  Continue  Zosyn And add azithromycin to cover atypical. DC vancomycin. since MRSA PCR negative Blood cultures so far negative Speech consult for risk of aspiration   2. Sepsis with hypotension: Patient presents with hypotension and tachycardia in the setting of pneumonia. Continue broad-spectrum and about except follow up on urine and blood culture Continue IV fluids Repeat lactic acid improving  3. Acute encephalopathy in the setting of problem #1 and 2 and severe Alzheimer's dementia: Continue to reorient patient Avoid benzodiazepines if possible  4. Acute kidney injury: Hold nephrotoxic agents Continue IV fluids Improving creatinine  5. History of atrial fibrillation - will need to hold metoprolol for now due to hypotension May add digoxin or amiodarone if heart rate consistently above 120  6. History of CHF: Order echocardiogram to evaluate ejection fraction  Patient not in heart failure  Case discussed with Care Management/Social Worker. Management plans discussed with the patient, family and they are in agreement.  CODE STATUS: DNR DVT Prophylaxis: lovenox  TOTAL TIME TAKING CARE OF THIS PATIENT:40 minutes.  >50% time spent on counselling and coordination of care  POSSIBLE D/C IN 1-2 DAYS, DEPENDING ON CLINICAL CONDITION.  Note: This dictation was prepared with Dragon dictation along with smaller phrase technology. Any transcriptional errors that result from this process are unintentional.  Harjit Douds M.D on 12/15/2016 at 11:52 AM  Between 7am to 6pm - Pager - [phone]  After 6pm go to [email or web address] - password [email or web address] [facility]  [facility]  Office  [phone]  CC: Primary care physician; No PCP Per Patient

## 2016-12-15 NOTE — Care Management Note (Addendum)
Case Management Note  Patient Details  Name: [name] MRN: [id number] Date of Birth: November 21, 1931  Subjective/Objective:      Resides at [facility]. Daughter Chancy HurterJanice Murphy, cell [phone], who is her Healthcare POA  requests that Mrs Mauricio PoMcCracken be discharged back to [facility] and not to a SNF. Mrs Mauricio PoMcCracken is [facility] open [facility] nursing.               Action/Plan:   Expected Discharge Date:                  Expected Discharge Plan:     In-House Referral:     Discharge planning Services     Post Acute Care Choice:    Choice offered to:     DME Arranged:    DME Agency:     HH Arranged:    HH Agency:     Status of Service:     If discussed at [employer] Length of Stay Meetings, dates discussed:    Additional Comments:  Macalister Arnaud A, RN 12/15/2016, 3:27 PM

## 2016-12-15 NOTE — Evaluation (Signed)
Physical Therapy Evaluation Patient Details Name: Paula Summers MRN: [id number] DOB: May 06, 1931 Today's Date: 12/15/2016   History of Present Illness  83 yo female with onset of sepsis from UTI and HCAP, now admitted from family care home.  PMHx:  dementia, a-fib, hypothyroidism, glaucoma, CHF,   Clinical Impression  Pt was seen for evaluation of her mobility with pt being too lethargic to get OOB.  Will anticiapte her need for SNF as she is likely to have been mobile in a family care home although cannot get history from her due to cognition and state of alertness.  Will follow acutely to see if she can start transferring to the chair and actively moving her legs more.    Follow Up Recommendations SNF    Equipment Recommendations  None recommended by PT    Recommendations for Other Services       Precautions / Restrictions Precautions Precautions: Fall (telemetry) Restrictions Weight Bearing Restrictions: No      Mobility  Bed Mobility Overal bed mobility: Needs Assistance Bed Mobility: Supine to Sit;Sit to Supine     Supine to sit: Total assist Sit to supine: Total assist   General bed mobility comments: weak and cannot balance bedside without help  Transfers                 General transfer comment: unable due to lethargy  Ambulation/Gait                Stairs            Wheelchair Mobility    Modified Rankin (Stroke Patients Only)       Balance Overall balance assessment: Needs assistance Sitting-balance support: Feet supported;Bilateral upper extremity supported Sitting balance-Leahy Scale: Poor                                       Pertinent Vitals/Pain Pain Assessment: Faces Pain Score: 0-No pain Faces Pain Scale: No hurt    Home Living Family/patient expects to be discharged to:: Group home                 Additional Comments: family care home with no PLOF on chart    Prior Function Level of  Independence: Needs assistance   Gait / Transfers Assistance Needed: no information and pt too lethargic to report  ADL's / Homemaking Assistance Needed: family care home to provide meals and cleaning        Hand Dominance        Extremity/Trunk Assessment   Upper Extremity Assessment Upper Extremity Assessment: Generalized weakness;Difficult to assess due to impaired cognition    Lower Extremity Assessment Lower Extremity Assessment: Generalized weakness;Difficult to assess due to impaired cognition    Cervical / Trunk Assessment Cervical / Trunk Assessment: Kyphotic  Communication   Communication: Expressive difficulties  Cognition Arousal/Alertness: Lethargic Behavior During Therapy: Flat affect (not responding to questions) Overall Cognitive Status: History of cognitive impairments - at baseline                 General Comments: pt seems more lethargic than is likely to have been PLOF    General Comments General comments (skin integrity, edema, etc.): Pt will assist movement in sitting a small amount but resistant to sitting bedside.  Finally asked her if she was ready to return to bed and pt leaned to the L very quickly.  Exercises General Exercises - Lower Extremity Ankle Circles/Pumps: PROM;Both;5 reps Long Arc Quad: PROM;Both;5 reps Heel Slides: PROM;Both;5 reps Hip ABduction/ADduction: PROM;Both;5 reps   Assessment/Plan    PT Assessment Patient needs continued PT services  PT Problem List Decreased strength;Decreased range of motion;Decreased activity tolerance;Decreased balance;Decreased mobility;Decreased coordination;Decreased cognition;Decreased safety awareness;Cardiopulmonary status limiting activity (O2 sats were 97% supine and sitting on 2L O2)          PT Treatment Interventions DME instruction;Functional mobility training;Therapeutic activities;Therapeutic exercise;Balance training;Neuromuscular re-education;Patient/family education     PT Goals (Current goals can be found in the Care Plan section)  Acute Rehab PT Goals Patient Stated Goal: unable to state PT Goal Formulation: Patient unable to participate in goal setting Time For Goal Achievement: 12/29/16 Potential to Achieve Goals: Good    Frequency Min 2X/week   Barriers to discharge Other (comment) (2 person assist to maneuver and no info about her home) No staffing assistance information about the care home    Co-evaluation               End of Session Equipment Utilized During Treatment: Oxygen Activity Tolerance: Patient limited by fatigue;Patient limited by lethargy Patient left: in bed;with call bell/phone within reach;with bed alarm set Nurse Communication: Mobility status         Time: 9562-1308 PT Time Calculation (min) (ACUTE ONLY): 28 min   Charges:   PT Evaluation $PT Eval Moderate Complexity: 1 Procedure PT Treatments $Therapeutic Activity: 8-22 mins   PT G Codes:        Ivar Drape Dec 21, 2016, 1:35 PM   Samul Dada, PT MS Acute Rehab Dept. Number: [facility] [id number] and [facility] [phone]

## 2016-12-15 NOTE — Progress Notes (Signed)
Paged Dr. Judithann SheenSparks.  Pt wheezing and with coarse lung sounds.  Orders for Lasix IV, Duoneb, and decrease in IV fluids obtained.

## 2016-12-15 NOTE — [place] FL2 (Deleted)
[place] MEDICAID FL2 LEVEL OF CARE SCREENING TOOL     IDENTIFICATION  Patient Name: Paula Summers Birthdate: 1931-02-13 Sex: female Admission Date (Current Location): 12/14/2016  [place]   Facility and Address:  [facility], [address], [place], [place] [address]      Provider Number: [id number]  Attending Physician Name and Address:  Enedina FinnerSona Patel, MD  Relative Name and Phone Number:       Current Level of Care: Hospital Recommended Level of Care: Family Care Home Prior Approval Number:    Date Approved/Denied:   PASRR Number:    Discharge Plan: Other (Comment) (Family Care Home)    Current Diagnoses: Patient Active Problem List   Diagnosis Date Noted  . Sepsis (HCC) 12/14/2016  . Recurrent UTI 02/17/2016  . A-fib (HCC) 02/16/2016  . Clinical depression 02/16/2016  . Dementia 02/16/2016  . Hypercholesteremia 02/16/2016  . Temporary cerebral vascular dysfunction 02/16/2016  . Excessive falling 02/16/2016  . Gait instability 02/16/2016  . Carotid stenosis   . Cholelithiasis without obstruction 05/14/2007  . Adult hypothyroidism 09/08/2006  . Allergic rhinitis 02/12/2006  . Colon, diverticulosis 02/12/2006  . Generalized OA 02/12/2006  . Glaucoma, compensated 02/12/2006  . BP (high blood pressure) 02/12/2006    Orientation RESPIRATION BLADDER Height & Weight     Self  O2 (o2 2L [place]) Continent Weight: 145 lb 11.2 oz (66.1 kg) Height:  5\' 1"  (154.9 cm)  BEHAVIORAL SYMPTOMS/MOOD NEUROLOGICAL BOWEL NUTRITION STATUS      Continent    AMBULATORY STATUS COMMUNICATION OF NEEDS Skin   Extensive Assist Verbally Normal                       Personal Care Assistance Level of Assistance  Bathing, Dressing, Feeding Bathing Assistance: Limited assistance Feeding assistance: Independent Dressing Assistance: Limited assistance     Functional Limitations Info             SPECIAL CARE FACTORS  FREQUENCY                       Contractures Contractures Info: Present    Additional Factors Info  Code Status, Allergies Code Status Info: DNR Allergies Info: Penicillins           Current Medications (12/15/2016):  This is the current hospital active medication list Current Facility-Administered Medications  Medication Dose Route Frequency Provider Last Rate Last Dose  . 0.9 %  sodium chloride infusion   Intravenous Continuous Marguarite ArbourJeffrey D Sparks, MD 20 mL/hr at 12/15/16 0507    . acetaminophen (TYLENOL) tablet 650 mg  650 mg Oral Q6H PRN Adrian SaranSital Mody, MD   650 mg at 12/15/16 [id number]   Or  . acetaminophen (TYLENOL) suppository 650 mg  650 mg Rectal Q6H PRN Adrian SaranSital Mody, MD      . acetaminophen (TYLENOL) tablet 1,000 mg  1,000 mg Oral QHS Adrian SaranSital Mody, MD   1,000 mg at 12/14/16 2030  . aspirin tablet 325 mg  325 mg Oral Daily Adrian SaranSital Mody, MD   325 mg at 12/15/16 1027  . azelastine (ASTELIN) 0.1 % nasal spray 1 spray  1 spray Each Nare BID Valentina GuScott D Christy, RPH   1 spray at 12/15/16 1030   And  . fluticasone (FLONASE) 50 MCG/ACT nasal spray 1 spray  1 spray Each Nare BID Valentina GuScott D Christy, RPH   1 spray at 12/15/16 1030  . azithromycin (ZITHROMAX) 250 mg  in dextrose 5 % 125 mL IVPB  250 mg Intravenous Q24H Enedina Finner, MD   250 mg at 12/15/16 1039  . benzonatate (TESSALON) capsule 100 mg  100 mg Oral TID PRN Enedina Finner, MD      . bisacodyl (DULCOLAX) suppository 10 mg  10 mg Rectal Daily PRN Adrian Saran, MD      . divalproex (DEPAKOTE) DR tablet 250 mg  250 mg Oral BID Adrian Saran, MD   250 mg at 12/15/16 1027  . enoxaparin (LOVENOX) injection 30 mg  30 mg Subcutaneous Q24H Sital Mody, MD   30 mg at 12/14/16 1606  . escitalopram (LEXAPRO) tablet 20 mg  20 mg Oral Daily Sital Mody, MD   20 mg at 12/15/16 1027  . furosemide (LASIX) tablet 10 mg  10 mg Oral Daily Adrian Saran, MD   10 mg at 12/15/16 1028  . guaiFENesin (MUCINEX) 12 hr tablet 600 mg  600 mg Oral BID Enedina Finner, MD   600 mg at 12/15/16  1039  . HYDROcodone-acetaminophen (NORCO/VICODIN) 5-325 MG per tablet 1-2 tablet  1-2 tablet Oral Q4H PRN Sital Mody, MD      . ipratropium-albuterol (DUONEB) 0.5-2.5 (3) MG/3ML nebulizer solution 3 mL  3 mL Nebulization Q6H Marguarite Arbour, MD   3 mL at 12/15/16 1318  . levothyroxine (SYNTHROID, LEVOTHROID) tablet 100 mcg  100 mcg Oral QAC breakfast Adrian Saran, MD   100 mcg at 12/15/16 0828  . liver oil-zinc oxide (DESITIN) 40 % ointment   Topical BID PRN Valentina Gu, RPH      . LORazepam (ATIVAN) tablet 0.5 mg  0.5 mg Oral Q8H PRN Adrian Saran, MD      . memantine (NAMENDA) tablet 10 mg  10 mg Oral BID Adrian Saran, MD   10 mg at 12/15/16 1027  . piperacillin-tazobactam (ZOSYN) IVPB 3.375 g  3.375 g Intravenous Q8H Melissa D Maccia, RPH   3.375 g at 12/15/16 1344  . pravastatin (PRAVACHOL) tablet 20 mg  20 mg Oral QHS Adrian Saran, MD   20 mg at 12/14/16 2030  . senna-docusate (Senokot-S) tablet 1 tablet  1 tablet Oral QHS PRN Adrian Saran, MD      . sodium chloride flush (NS) 0.9 % injection 3 mL  3 mL Intravenous Q12H Sital Mody, MD   3 mL at 12/15/16 1028  . timolol (TIMOPTIC) 0.5 % ophthalmic solution 1 drop  1 drop Both Eyes BID Adrian Saran, MD   1 drop at 12/15/16 1030     Discharge Medications: Please see discharge summary for a list of discharge medications.  Relevant Imaging Results:  Relevant Lab Results:   Additional Information SS# [id number]  Judi Cong, LCSW

## 2016-12-15 NOTE — Clinical Social Work Note (Signed)
Clinical Social Work Assessment  Patient Details  Name: Babs BertinJeanette W Diaz MRN: [id number] Date of Birth: [birth year]  Date of referral:  12/15/16               Reason for consult:  Facility Placement                Permission sought to share information with:  Facility Industrial/product designerContact Representative Permission granted to share information::  Yes, Verbal Permission Granted  Name::        Agency::     Relationship::     Contact Information:     Housing/Transportation Living arrangements for the past 2 months:  [facility] Source of Information:  Facility, Adult Children Patient Interpreter Needed:  None Criminal Activity/Legal Involvement Pertinent to Current Situation/Hospitalization:  No - Comment as needed Significant Relationships:  Adult Children, Community Support Lives with:  Facility Resident Do you feel safe going back to the place where you live?  Yes Need for family participation in patient care:  Yes (Comment)  Care giving concerns:  [facility]   Social Worker assessment / plan:  CSW attempted to visit the patient and family at bedside, but the family was not in the room. Due to the patient's dementia complicated by AMS, the CSW contacted her HCPOA/daughter, Liborio NixonJanice to discuss PT recommendation for SNF. Because her mother has dementia, Liborio NixonJanice is declining SNF and reported that she would [facility]. [facility] that Para MarchJeanette could return with such, and they agreed. The agency used previously was [facility]. RNCM is aware and has contacted [facility] to alert them to possible orders for additional services. CSW will con't to follow [facility].    Employment status:  Retired Database administratornsurance information:  Managed Medicare PT Recommendations:  Skilled Nursing Facility Information / Referral to community resources:     Patient/Family's Response to care:  Family thanked CSW for  assistance.  Patient/Family's Understanding of and Emotional Response to Diagnosis, Current Treatment, and Prognosis:  Family is quite involved with care and do not want the patient to go to a SNF due to memory care issues.  Emotional Assessment Appearance:  Appears stated age Attitude/Demeanor/Rapport:  Lethargic Affect (typically observed):  Appropriate Orientation:  Oriented to Self Alcohol / Substance use:  Never Used Psych involvement (Current and /or in the community):  No (Comment)  Discharge Needs  Concerns to be addressed:  Care Coordination Readmission within the last 30 days:  No Current discharge risk:  None Barriers to Discharge:  Continued Medical Work up   UAL CorporationKaren M Uvaldo Rybacki, LCSW 12/15/2016, 3:24 PM

## 2016-12-16 LAB — PROCALCITONIN: Procalcitonin: <0.1 ng/mL

## 2016-12-16 LAB — URINE CULTURE: Culture: >=100000 — AB

## 2016-12-16 LAB — ECHOCARDIOGRAM COMPLETE
Height: 61 in
WEIGHTICAEL: 2331.2 [oz_av]

## 2016-12-16 MED ORDER — CEFUROXIME AXETIL 500 MG PO TABS: 500.0000 mg | ORAL_TABLET | Freq: Two times a day (BID) | ORAL | 0 refills | Status: DC

## 2016-12-16 MED ORDER — AZITHROMYCIN 250 MG PO TABS: ORAL_TABLET | ORAL | 0 refills | Status: DC

## 2016-12-16 MED ORDER — POTASSIUM CHLORIDE CRYS ER 10 MEQ PO TBCR
10.0000 meq | EXTENDED_RELEASE_TABLET | Freq: Every day | ORAL | Status: DC
  Administered 2016-12-16 – 2016-12-20 (×4): 10 meq via ORAL
  Filled 2016-12-16 (×6): qty 1

## 2016-12-16 MED ORDER — IPRATROPIUM-ALBUTEROL 0.5-2.5 (3) MG/3ML IN SOLN
3.0000 mL | Freq: Once | RESPIRATORY_TRACT | Status: AC
Start: 2016-12-16 — End: 2016-12-16
  Administered 2016-12-16: 13:00:00 3 mL via RESPIRATORY_TRACT

## 2016-12-16 MED ORDER — CEFUROXIME AXETIL 500 MG PO TABS
500.0000 mg | ORAL_TABLET | Freq: Two times a day (BID) | ORAL | Status: DC
  Administered 2016-12-16 – 2016-12-17 (×2): 500 mg via ORAL
  Filled 2016-12-16 (×2): qty 1

## 2016-12-16 MED ORDER — METOPROLOL SUCCINATE ER 25 MG PO TB24
25.0000 mg | ORAL_TABLET | Freq: Every day | ORAL | Status: DC
  Administered 2016-12-16 – 2016-12-20 (×4): 25 mg via ORAL
  Filled 2016-12-16 (×5): qty 1

## 2016-12-16 MED ORDER — AZITHROMYCIN 250 MG PO TABS
250.0000 mg | ORAL_TABLET | Freq: Every day | ORAL | Status: DC
  Administered 2016-12-16 – 2016-12-17 (×2): 250 mg via ORAL
  Filled 2016-12-16 (×2): qty 1

## 2016-12-16 MED ORDER — ORAL CARE MOUTH RINSE
15.0000 mL | Freq: Two times a day (BID) | OROMUCOSAL | Status: DC
  Administered 2016-12-16 – 2016-12-23 (×13): 15 mL via OROMUCOSAL

## 2016-12-16 MED ORDER — BENZONATATE 100 MG PO CAPS: 100.0000 mg | ORAL_CAPSULE | Freq: Three times a day (TID) | ORAL | 0 refills | Status: AC | PRN

## 2016-12-16 NOTE — [place] FL2 (Signed)
[place] MEDICAID FL2 LEVEL OF CARE SCREENING TOOL     IDENTIFICATION  Patient Name: Paula Summers Birthdate: 08-26-1931 Sex: female Admission Date (Current Location): 12/14/2016  [place]   Facility and Address:  [facility], [address], [place], [place] [address]      Provider Number: [id number]  Attending Physician Name and Address:  Enedina FinnerSona Patel, MD  Relative Name and Phone Number:     Trenda MootsMurphy,Janice A Daughter [phone]  [phone]   Shelah Lewandowskylderman,Kathy Daughter [phone]  [phone]       Current Level of Care: Hospital Recommended Level of Care: Skilled Nursing Facility Prior Approval Number:    Date Approved/Denied:   PASRR Number: [id number]  Discharge Plan: SNF    Current Diagnoses: Patient Active Problem List   Diagnosis Date Noted  . Sepsis (HCC) 12/14/2016  . Recurrent UTI 02/17/2016  . A-fib (HCC) 02/16/2016  . Clinical depression 02/16/2016  . Dementia 02/16/2016  . Hypercholesteremia 02/16/2016  . Temporary cerebral vascular dysfunction 02/16/2016  . Excessive falling 02/16/2016  . Gait instability 02/16/2016  . Carotid stenosis   . Cholelithiasis without obstruction 05/14/2007  . Adult hypothyroidism 09/08/2006  . Allergic rhinitis 02/12/2006  . Colon, diverticulosis 02/12/2006  . Generalized OA 02/12/2006  . Glaucoma, compensated 02/12/2006  . BP (high blood pressure) 02/12/2006    Orientation RESPIRATION BLADDER Height & Weight     Self  O2 (3L) Continent Weight: 147 lb 9.6 oz (67 kg) Height:  5\' 1"  (154.9 cm)  BEHAVIORAL SYMPTOMS/MOOD NEUROLOGICAL BOWEL NUTRITION STATUS      Continent Diet (Low sodium Heart Healthy)  AMBULATORY STATUS COMMUNICATION OF NEEDS Skin   Extensive Assist Verbally Normal                       Personal Care Assistance Level of Assistance  Bathing, Feeding, Dressing Bathing Assistance: Limited assistance Feeding assistance:  Independent Dressing Assistance: Limited assistance     Functional Limitations Info  Sight, Hearing, Speech Sight Info: Adequate Hearing Info: Adequate Speech Info: Adequate    SPECIAL CARE FACTORS FREQUENCY  PT (By licensed PT)     PT Frequency: 5x a week              Contractures Contractures Info: Present    Additional Factors Info  Code Status, Allergies, Psychotropic Code Status Info: DNR Allergies Info: Penicillins Psychotropic Info: escitalopram (LEXAPRO) tablet 20 mg         Current Medications (12/16/2016):  This is the current hospital active medication list Current Facility-Administered Medications  Medication Dose Route Frequency Provider Last Rate Last Dose  . 0.9 %  sodium chloride infusion   Intravenous Continuous Marguarite ArbourJeffrey D Sparks, MD 20 mL/hr at 12/16/16 [id number]    . acetaminophen (TYLENOL) tablet 650 mg  650 mg Oral Q6H PRN Adrian SaranSital Mody, MD   650 mg at 12/15/16 [id number]   Or  . acetaminophen (TYLENOL) suppository 650 mg  650 mg Rectal Q6H PRN Adrian SaranSital Mody, MD      . acetaminophen (TYLENOL) tablet 1,000 mg  1,000 mg Oral QHS Adrian SaranSital Mody, MD   1,000 mg at 12/15/16 2222  . aspirin tablet 325 mg  325 mg Oral Daily Adrian SaranSital Mody, MD   325 mg at 12/16/16 1145  . azelastine (ASTELIN) 0.1 % nasal spray 1 spray  1 spray Each Nare BID Valentina GuScott D Christy, RPH   1 spray at 12/16/16 1146   And  . fluticasone (FLONASE) 50  MCG/ACT nasal spray 1 spray  1 spray Each Nare BID Valentina Gu, RPH   1 spray at 12/16/16 1149  . azithromycin (ZITHROMAX) tablet 250 mg  250 mg Oral Daily Enedina Finner, MD      . benzonatate (TESSALON) capsule 100 mg  100 mg Oral TID PRN Enedina Finner, MD   100 mg at 12/16/16 0332  . bisacodyl (DULCOLAX) suppository 10 mg  10 mg Rectal Daily PRN Adrian Saran, MD      . cefUROXime (CEFTIN) tablet 500 mg  500 mg Oral BID WC Enedina Finner, MD      . divalproex (DEPAKOTE) DR tablet 250 mg  250 mg Oral BID Adrian Saran, MD   250 mg at 12/16/16 1145  . enoxaparin (LOVENOX)  injection 30 mg  30 mg Subcutaneous Q24H Sital Mody, MD   30 mg at 12/15/16 1605  . escitalopram (LEXAPRO) tablet 20 mg  20 mg Oral Daily Sital Mody, MD   20 mg at 12/16/16 1146  . furosemide (LASIX) tablet 10 mg  10 mg Oral Daily Adrian Saran, MD   10 mg at 12/16/16 1146  . guaiFENesin (MUCINEX) 12 hr tablet 600 mg  600 mg Oral BID Enedina Finner, MD   600 mg at 12/16/16 1145  . HYDROcodone-acetaminophen (NORCO/VICODIN) 5-325 MG per tablet 1-2 tablet  1-2 tablet Oral Q4H PRN Sital Mody, MD      . ipratropium-albuterol (DUONEB) 0.5-2.5 (3) MG/3ML nebulizer solution 3 mL  3 mL Nebulization Q6H Marguarite Arbour, MD   3 mL at 12/16/16 1610  . ipratropium-albuterol (DUONEB) 0.5-2.5 (3) MG/3ML nebulizer solution 3 mL  3 mL Nebulization Once Enedina Finner, MD      . levothyroxine (SYNTHROID, LEVOTHROID) tablet 100 mcg  100 mcg Oral QAC breakfast Adrian Saran, MD   100 mcg at 12/15/16 0828  . liver oil-zinc oxide (DESITIN) 40 % ointment   Topical BID PRN Valentina Gu, RPH      . LORazepam (ATIVAN) tablet 0.5 mg  0.5 mg Oral Q8H PRN Adrian Saran, MD      . memantine (NAMENDA) tablet 10 mg  10 mg Oral BID Adrian Saran, MD   10 mg at 12/16/16 1145  . metoprolol succinate (TOPROL-XL) 24 hr tablet 25 mg  25 mg Oral Daily Enedina Finner, MD      . potassium chloride (K-DUR) CR tablet 10 mEq  10 mEq Oral Daily Enedina Finner, MD      . pravastatin (PRAVACHOL) tablet 20 mg  20 mg Oral QHS Adrian Saran, MD   20 mg at 12/15/16 2222  . senna-docusate (Senokot-S) tablet 1 tablet  1 tablet Oral QHS PRN Adrian Saran, MD      . sodium chloride flush (NS) 0.9 % injection 3 mL  3 mL Intravenous Q12H Sital Mody, MD   3 mL at 12/15/16 1028  . timolol (TIMOPTIC) 0.5 % ophthalmic solution 1 drop  1 drop Both Eyes BID Adrian Saran, MD   1 drop at 12/16/16 1150     Discharge Medications: Please see discharge summary for a list of discharge medications.  Relevant Imaging Results:  Relevant Lab Results:   Additional Information SS#  [id number]  [place]

## 2016-12-16 NOTE — Clinical Social Work Note (Signed)
CSW received phone call from [facility] who said they can accept patient once insurance authorization has been received.  [facility] to begin insurance authorization for patient to go to SNF, CSW will be notified if insurance approves.  Ervin KnackEric R. Jaizon Deroos, MSW, Theresia MajorsLCSWA [phone]  12/16/2016 2:16 PM

## 2016-12-16 NOTE — Clinical Social Work Note (Signed)
CSW spoke to Clydie [facility]Karen at [facility] [phone] and she said there is only one staff with five residents and they would like patient to get some short term rehab first before she is able to return back to group home.  CSW contacted patient's daughter Liborio NixonJanice at [phone] who is in agreement to have patient gone to SNF first before returning back to group home.  CSW explained to patient's daughter it may take a day or two for insurance to give authorization for patient.  CSW was given permission to begin bed search, patient's daughter said she would prefer [facility] or [facility], CSW explained to daughter it will depend on if insurance approves and bed availability.  CSW to continue to follow patient's progress throughout discharge planning.  Ervin KnackEric R. Sharryn Belding, MSW, Theresia MajorsLCSWA [phone]  12/16/2016 1:02 PM

## 2016-12-16 NOTE — Evaluation (Signed)
Clinical/Bedside Swallow Evaluation Patient Details  Name: Paula Summers MRN: [id number] Date of Birth: 09-14-1931  Today's Date: 12/16/2016 Time: SLP Start Time (ACUTE ONLY): 1100 SLP Stop Time (ACUTE ONLY): 1200 SLP Time Calculation (min) (ACUTE ONLY): 60 min  Past Medical History:  Past Medical History:  Diagnosis Date  . Alzheimer disease   . Atrial fibrillation (HCC)   . Breast screening, unspecified   . Carotid stenosis   . CHF (congestive heart failure) (HCC)   . Depression   . Glaucoma   . Hyperlipidemia   . Hypertension   . Hypothyroidism   . Personal history of tobacco use, presenting hazards to health   . Special screening for malignant neoplasms, colon    Past Surgical History:  Past Surgical History:  Procedure Laterality Date  . APPENDECTOMY  1940  . BACK SURGERY  [phone],2004  . CAROTID ENDARTERECTOMY Left 2010  . COLONOSCOPY  2006   [place]  . TONSILLECTOMY  1940   HPI:  Pt is a 83 y.o. female with a known history of Severe dementia, atrial fibrillation and hypothyroidism who [facility] daughter for above complaint. Patient was seen by neurologist on Tuesday for routine follow-up and the daughter was concerned for UTI so urine was checked and she was diagnosed with UTI. She was prescribed ciprofloxacin which she has been taking since Wednesday. This morning the group home called the daughter due to patient being lethargic, having chills, and worsening confusion on baseline severe dementia. In the emergency room chest x-ray shows possible pneumonia and urine analysis does not show evidence of UTI. Her blood pressure systolic has been in the 80s to 90s. She has received IV fluids. Daughter has noted pt can easily become "choked" when "gulping and drinking" - "she does better if I let her only drink small sips at a time". Pt presented w/ confusion and responded to general greeting only - delayed. She required verbal cues  and support for all tasks.    Assessment / Plan / Recommendation Clinical Impression  Pt appears at min increased risk for aspiration secondary to Cognitive decline and declined medical status w/ weakness overall. Pt consumed po trials w/ min oral phase deficits and need for strict monitoring of bolus size w/ thin liquid trials. Daughter has noted pt to become choked/coughing w/ multiple sips of thin liquids. Pt wasrestrited to single sips by pinching straw which seemed to aid pt's swallowing w/ only a mild throat clearing noted x1 during trials. Min increased mastication time/effort noted w/ increased textured foods. Recommend a Dysphagia level 2 w/ thin liquids w/ strict aspiration precautions and monitoring of bolus size - pinching straw; small cup sips. Recommend feeding support at meals and Pills given in Puree - Crushed as able. Monitor pt's Pulmonary status and if any decline, or if s/s of aspiration noted, recommend a downgrade of diet consistency to Nectar consistency liquids and f/u w/ objective swallow assessment.     Aspiration Risk  Mild aspiration risk    Diet Recommendation  Dysphagia level 2 w/ thin liquids - monitor bolus size amounts; strict aspiration precautions and support w/ feeding at meals.   Medication Administration: Crushed with puree    Other  Recommendations Recommended Consults:  (Dietician) Oral Care Recommendations: Oral care BID;Staff/trained caregiver to provide oral care Other Recommendations:  (TBD)   Follow up Recommendations Skilled Nursing facility      Frequency and Duration min 3x week  2 weeks  Prognosis Prognosis for Safe Diet Advancement: Fair Barriers to Reach Goals: Cognitive deficits      Swallow Study   General Date of Onset: 12/14/16 HPI: Pt is a 83 y.o. female with a known history of Severe dementia, atrial fibrillation and hypothyroidism who [facility] daughter for above complaint.  Patient was seen by neurologist on Tuesday for routine follow-up and the daughter was concerned for UTI so urine was checked and she was diagnosed with UTI. She was prescribed ciprofloxacin which she has been taking since Wednesday. This morning the group home called the daughter due to patient being lethargic, having chills, and worsening confusion on baseline severe dementia. In the emergency room chest x-ray shows possible pneumonia and urine analysis does not show evidence of UTI. Her blood pressure systolic has been in the 80s to 90s. She has received IV fluids. Daughter has noted pt can easily become "choked" when "gulping and drinking" - "she does better if I let her only drink small sips at a time". Pt presented w/ confusion and responded to general greeting only - delayed. She required verbal cues and support for all tasks.  Type of Study: Bedside Swallow Evaluation Previous Swallow Assessment: none noted in chart Diet Prior to this Study: Regular Temperature Spikes Noted: No (wbc not elevated) Respiratory Status: Nasal cannula (2-3 liters) History of Recent Intubation: No Behavior/Cognition: Alert;Cooperative;Pleasant mood;Confused;Requires cueing;Distractible Oral Cavity Assessment: Within Functional Limits;Dry (sticky) Oral Care Completed by SLP: Recent completion by staff Oral Cavity - Dentition: Adequate natural dentition (missing few molars) Vision: Functional for self-feeding (appeared) Self-Feeding Abilities: Total assist (held cup to drink w/ support) Patient Positioning: Upright in bed Baseline Vocal Quality: Low vocal intensity Volitional Cough: Congested Volitional Swallow: Unable to elicit    Oral/Motor/Sensory Function Overall Oral Motor/Sensory Function: Within functional limits (grossly; appeared wfl w/ bolus management)   Ice Chips Ice chips: Not tested   Thin Liquid Thin Liquid: Impaired Presentation: Cup;Straw;Self Fed (assisted; pinched straw to limit) Pharyngeal   Phase Impairments: Throat Clearing - Delayed (x1 ) Other Comments: pt was impulsive w/ drinking requiring monitoring for single sips only    Nectar Thick Nectar Thick Liquid: Not tested   Honey Thick Honey Thick Liquid: Not tested   Puree Puree: Within functional limits Presentation: Spoon (fed; 6 trials)   Solid   GO   Solid: Impaired (mech soft trials) Presentation: Spoon (fed; 3 trials) Oral Phase Impairments: Impaired mastication (increased time) Oral Phase Functional Implications: Impaired mastication (increased time) Pharyngeal Phase Impairments:  (none)        Jerilynn Som, MS, CCC-SLP Clarinda Obi 12/16/2016,6:22 PM

## 2016-12-16 NOTE — Progress Notes (Signed)
Patient placed on 6lnc due to patient being uncomfortable and pulling off venturi mask. On 6L  patients sat is 92% at this time. Lance BoschShea RN notified

## 2016-12-16 NOTE — Care Management Important Message (Signed)
Important Message  Patient Details  Name: Paula Summers MRN: [id number] Date of Birth: [birth year]   Medicare Important Message Given:  Yes    Gwenette GreetBrenda S Amire Leazer, RN 12/16/2016, 11:45 AM

## 2016-12-16 NOTE — Discharge Summary (Signed)
[facility] - [place] at [facility]   PATIENT NAME: Paula Summers    MR#:  [id number]  DATE OF BIRTH:  25-Jun-1931  DATE OF ADMISSION:  12/14/2016 ADMITTING PHYSICIAN: Paula Saran, MD  DATE OF DISCHARGE: 12/16/16  PRIMARY CARE PHYSICIAN: No PCP Per Patient    ADMISSION DIAGNOSIS:  Pneumonia [J18.9] Sepsis, due to unspecified organism (HCC) [A41.9] Altered mental status, unspecified altered mental status type [R41.82]  DISCHARGE DIAGNOSIS:  Sepsis due to acute bronchitis and E coli UTI Alzheimer's dementia Acute renal fialure-improved Ecoli UTI SECONDARY DIAGNOSIS:   Past Medical History:  Diagnosis Date  . Alzheimer disease   . Atrial fibrillation (HCC)   . Breast screening, unspecified   . Carotid stenosis   . CHF (congestive heart failure) (HCC)   . Depression   . Glaucoma   . Hyperlipidemia   . Hypertension   . Hypothyroidism   . Personal history of tobacco use, presenting hazards to health   . Special screening for malignant neoplasms, colon     HOSPITAL COURSE:   83 year old female with severe Alzheimer's dementia who presents with acute encephalopathy and acute hypoxic respiratory failure with sepsis.  1. Acute hypoxia respiratory failure due to Acute bronchtis and e coli UTI Continue  Zosyn And add azithromycin to cover atypical.---change to po ceftin and zithromax DC vancomycin. since MRSA PCR negative Blood cultures so far negative  Speech consult for risk of aspiration  2. Sepsis with hypotension: Patient presents with hypotension and tachycardia in the setting of pneumonia. Received V fluids Repeat lactic acid improving Sepsis resolved  3. Acute encephalopathy in the setting of problem #1 and 2 and severe Alzheimer's dementia: Continue to reorient patient Avoid benzodiazepines if possible  4. Acute kidney injury: Hold nephrotoxic agents Received  IV fluids Improving creatinine  5. History of atrial  fibrillation - resumed BB  6. History of CHF:  Patient not in heart failure Echo done results pending  Pt much improved. CSW for d/c planning Pt is ready for d/c CONSULTS OBTAINED:    DRUG ALLERGIES:   Allergies  Allergen Reactions  . Penicillins Hives    DISCHARGE MEDICATIONS:   Current Discharge Medication List    START taking these medications   Details  azithromycin (ZITHROMAX) 250 MG tablet Take daily as directed Qty: 4 each, Refills: 0    benzonatate (TESSALON) 100 MG capsule Take 1 capsule (100 mg total) by mouth 3 (three) times daily as needed for cough. Qty: 20 capsule, Refills: 0    cefUROXime (CEFTIN) 500 MG tablet Take 1 tablet (500 mg total) by mouth 2 (two) times daily with a meal. Qty: 16 tablet, Refills: 0      CONTINUE these medications which have NOT CHANGED   Details  acetaminophen (TYLENOL) 500 MG tablet Take 1,000 mg by mouth at bedtime.     aspirin 325 MG tablet Take 1 tablet (325 mg total) by mouth daily. Qty: 90 tablet, Refills: 3    Azelastine-Fluticasone 137-50 MCG/ACT SUSP Place 2 sprays into the nose 2 (two) times daily.     Cranberry 450 MG CAPS Take 1 capsule by mouth 2 (two) times daily.    divalproex (DEPAKOTE) 250 MG DR tablet Take 250 mg by mouth 2 (two) times daily.    escitalopram (LEXAPRO) 20 MG tablet Take 20 mg by mouth daily.    furosemide (LASIX) 20 MG tablet Take 10 mg by mouth daily.    guaiFENesin (MUCINEX) 600 MG 12 hr tablet Take 600  mg by mouth 2 (two) times daily as needed.    levothyroxine (SYNTHROID, LEVOTHROID) 100 MCG tablet Take 100 mcg by mouth daily.  Refills: 98    LORazepam (ATIVAN) 0.5 MG tablet Take 1 tablet (0.5 mg total) by mouth every 8 (eight) hours as needed for anxiety. Qty: 15 tablet, Refills: 0    memantine (NAMENDA) 10 MG tablet Take 10 mg by mouth 2 (two) times daily.    metoprolol succinate (TOPROL-XL) 25 MG 24 hr tablet Take 1 tablet (25 mg total) by mouth daily. Qty: 90 tablet,  Refills: 3    potassium chloride (K-DUR) 10 MEQ tablet Take 1 tablet (10 mEq total) by mouth daily. Qty: 90 tablet, Refills: 3    pravastatin (PRAVACHOL) 20 MG tablet Take 1 tablet (20 mg total) by mouth daily. Qty: 90 tablet, Refills: 3    Skin Protectants, Misc. (DIMETHICONE-ZINC OXIDE) cream Apply topically 2 (two) times daily as needed (to peri area). To peri area    timolol (TIMOPTIC) 0.5 % ophthalmic solution Place 1 drop into both eyes 2 (two) times daily.       STOP taking these medications     ciprofloxacin (CIPRO) 500 MG tablet         If you experience worsening of your admission symptoms, develop shortness of breath, life threatening emergency, suicidal or homicidal thoughts you must seek medical attention immediately by calling 911 or calling your MD immediately  if symptoms less severe.  You Must read complete instructions/literature along with all the possible adverse reactions/side effects for all the Medicines you take and that have been prescribed to you. Take any new Medicines after you have completely understood and accept all the possible adverse reactions/side effects.   Please note  You were cared for by a hospitalist during your hospital stay. If you have any questions about your discharge medications or the care you received while you were in the hospital after you are discharged, you can call the unit and asked to speak with the hospitalist on call if the hospitalist that took care of you is not available. Once you are discharged, your primary care physician will handle any further medical issues. Please note that NO REFILLS for any discharge medications will be authorized once you are discharged, as it is imperative that you return to your primary care physician (or establish a relationship with a primary care physician if you do not have one) for your aftercare needs so that they can reassess your need for medications and monitor your lab values. CBC   Recent  Labs Lab 12/15/16 0456  WBC 7.6  HGB 10.1*  HCT 30.6*  PLT 192    Chemistries   Recent Labs Lab 12/14/16 1008 12/15/16 0456  NA 137 140  K 4.9 3.8  CL 104 108  CO2 20* 21*  GLUCOSE 122* 89  BUN 45* 51*  CREATININE 1.72* 1.61*  CALCIUM 8.5* 7.8*  AST 51*  --   ALT 27  --   ALKPHOS 69  --   BILITOT 0.7  --     Microbiology Results   Recent Results (from the past 240 hour(s))  Culture, blood (Routine x 2)     Status: None (Preliminary result)   Collection Time: 12/14/16 10:09 AM  Result Value Ref Range Status   Specimen Description BLOOD RIGHT AC  Final   Special Requests   Final    BOTTLES DRAWN AEROBIC AND ANAEROBIC ANA14ML AER14ML   Culture NO GROWTH 2 DAYS  Final   Report Status PENDING  Incomplete  Urine culture     Status: Abnormal   Collection Time: 12/14/16 10:09 AM  Result Value Ref Range Status   Specimen Description URINE, RANDOM  Final   Special Requests NONE  Final   Culture >=100,000 COLONIES/mL ESCHERICHIA COLI (A)  Final   Report Status 12/16/2016 FINAL  Final   Organism ID, Bacteria ESCHERICHIA COLI (A)  Final      Susceptibility   Escherichia coli - MIC*    AMPICILLIN >=32 RESISTANT Resistant     CEFAZOLIN 8 SENSITIVE Sensitive     CEFTRIAXONE <=1 SENSITIVE Sensitive     CIPROFLOXACIN >=4 RESISTANT Resistant     GENTAMICIN <=1 SENSITIVE Sensitive     IMIPENEM <=0.25 SENSITIVE Sensitive     NITROFURANTOIN <=16 SENSITIVE Sensitive     TRIMETH/SULFA <=20 SENSITIVE Sensitive     AMPICILLIN/SULBACTAM 16 INTERMEDIATE Intermediate     PIP/TAZO <=4 SENSITIVE Sensitive     Extended ESBL NEGATIVE Sensitive     * >=100,000 COLONIES/mL ESCHERICHIA COLI  Culture, blood (Routine x 2)     Status: None (Preliminary result)   Collection Time: 12/14/16 10:10 AM  Result Value Ref Range Status   Specimen Description BLOOD RIGHT ASSIST CONTROL  Final   Special Requests   Final    BOTTLES DRAWN AEROBIC AND ANAEROBIC ANA10ML AER10ML   Culture NO GROWTH 2  DAYS  Final   Report Status PENDING  Incomplete  MRSA PCR Screening     Status: None   Collection Time: 12/14/16  6:06 PM  Result Value Ref Range Status   MRSA by PCR NEGATIVE NEGATIVE Final    Comment:        The GeneXpert MRSA Assay (FDA approved for NASAL specimens only), is one component of a comprehensive MRSA colonization surveillance program. It is not intended to diagnose MRSA infection nor to guide or monitor treatment for MRSA infections.     RADIOLOGY:  Dg Chest 1 View  Result Date: 12/15/2016 CLINICAL DATA:  83 year old female with history of pneumonia. Alzheimer's disease. EXAM: CHEST 1 VIEW COMPARISON:  Chest x-ray 12/14/2016. FINDINGS: Lung volumes are low. Diffuse interstitial prominence most evident throughout the mid to lower lungs bilaterally which appears to be chronic and slowly progressively worsening over the past several years. Diffuse peribronchial cuffing. No confluent consolidative airspace disease. No pleural effusions. No evidence of pulmonary edema. Heart size is normal. Upper mediastinal contours are within normal limits. Aortic atherosclerosis. IMPRESSION: 1. The appearance of the lungs is most suggestive of a slowly progressive interstitial lung disease and could be best evaluated with followup nonemergent high-resolution chest CT in the near future if clinically appropriate. The possibility of superimposed acute bronchitis is not excluded, but is not strongly favored. 2. Aortic atherosclerosis. Electronically Signed   By: Trudie Reed M.D.   On: 12/15/2016 08:00     Management plans discussed with the patient, family and they are in agreement.  CODE STATUS:     Code Status Orders        Start     Ordered   12/14/16 1514  Do not attempt resuscitation (DNR)  Continuous    Question Answer Comment  In the event of cardiac or respiratory ARREST Do not call a "code blue"   In the event of cardiac or respiratory ARREST Do not perform Intubation,  CPR, defibrillation or ACLS   In the event of cardiac or respiratory ARREST Use medication by any route, position, wound  care, and other measures to relive pain and suffering. May use oxygen, suction and manual treatment of airway obstruction as needed for comfort.      12/14/16 1513    Code Status History    Date Active Date Inactive Code Status Order ID Comments User Context   This patient has a current code status but no historical code status.    Advance Directive Documentation   Flowsheet Row Most Recent Value  Type of Advance Directive  Out of facility DNR (pink MOST or yellow form), Healthcare Power of Attorney  Pre-existing out of facility DNR order (yellow form or pink MOST form)  Yellow form placed in chart (order not valid for inpatient use)  "MOST" Form in Place?  No data      TOTAL TIME TAKING CARE OF THIS PATIENT: 40 minutes.    Alyzabeth Pontillo M.D on 12/16/2016 at 12:06 PM  Between 7am to 6pm - Pager - [phone] After 6pm go to [email or web address] - password [email or web address] [facility]  [facility]  Office  [phone]  CC: Primary care physician; No PCP Per Patient

## 2016-12-16 NOTE — Discharge Instructions (Signed)
tp f/u with PCP for the group home

## 2016-12-16 NOTE — Progress Notes (Signed)
Placed patient on 35% venturi mask after neb treatment due to sats in the mid 80'/s on 4lnc. Sat increased to 92%. RN Lance BoschShea notified. Will continue to monitor.

## 2016-12-16 NOTE — Progress Notes (Signed)
[facility] - [place] at [facility]   PATIENT NAME: Paula Summers    MR#:  [id number]  DATE OF BIRTH:  1931-06-12  SUBJECTIVE:   Came in with family group home with fever or chills not feeling well.  Some cough but unable to bring up phlegm Patient pleasantly confused. Ate good BF dter in the room more alert today REVIEW OF SYSTEMS:   Review of Systems  Unable to perform ROS: Dementia   Tolerating Diet:yes Tolerating PT: recommends SNF but family requesting for group home with PT  DRUG ALLERGIES:   Allergies  Allergen Reactions  . Penicillins Hives    VITALS:  Blood pressure 122/78, pulse 80, temperature (!) 96.3 F (35.7 C), temperature source Axillary, resp. rate (!) 22, height 5\' 1"  (1.549 m), weight 67 kg (147 lb 9.6 oz), SpO2 91 %.  PHYSICAL EXAMINATION:   Physical Exam  GENERAL:  83 y.o.-year-old patient lying in the bed with no acute distress.  EYES: Pupils equal, round, reactive to light and accommodation. No scleral icterus. Extraocular muscles intact.  HEENT: Head atraumatic, normocephalic. Oropharynx and nasopharynx clear.  NECK:  Supple, no jugular venous distention. No thyroid enlargement, no tenderness.  LUNGS: Normal breath sounds bilaterally, no wheezing, rales, rhonchi. No use of accessory muscles of respiration.  CARDIOVASCULAR: S1, S2 normal. No murmurs, rubs, or gallops.  ABDOMEN: Soft, nontender, nondistended. Bowel sounds present. No organomegaly or mass.  EXTREMITIES: No cyanosis, clubbing or edema b/l.    NEUROLOGIC: Grossly nonfocal unable to assess secondary to dementia PSYCHIATRIC:  patient is alert  SKIN: No obvious rash, lesion, or ulcer.   LABORATORY PANEL:  CBC  Recent Labs Lab 12/15/16 0456  WBC 7.6  HGB 10.1*  HCT 30.6*  PLT 192    Chemistries   Recent Labs Lab 12/14/16 1008 12/15/16 0456  NA 137 140  K 4.9 3.8  CL 104 108  CO2 20* 21*  GLUCOSE 122* 89  BUN 45* 51*  CREATININE 1.72*  1.61*  CALCIUM 8.5* 7.8*  AST 51*  --   ALT 27  --   ALKPHOS 69  --   BILITOT 0.7  --    Cardiac Enzymes No results for input(s): TROPONINI in the last 168 hours. RADIOLOGY:  Dg Chest 1 View  Result Date: 12/15/2016 CLINICAL DATA:  83 year old female with history of pneumonia. Alzheimer's disease. EXAM: CHEST 1 VIEW COMPARISON:  Chest x-ray 12/14/2016. FINDINGS: Lung volumes are low. Diffuse interstitial prominence most evident throughout the mid to lower lungs bilaterally which appears to be chronic and slowly progressively worsening over the past several years. Diffuse peribronchial cuffing. No confluent consolidative airspace disease. No pleural effusions. No evidence of pulmonary edema. Heart size is normal. Upper mediastinal contours are within normal limits. Aortic atherosclerosis. IMPRESSION: 1. The appearance of the lungs is most suggestive of a slowly progressive interstitial lung disease and could be best evaluated with followup nonemergent high-resolution chest CT in the near future if clinically appropriate. The possibility of superimposed acute bronchitis is not excluded, but is not strongly favored. 2. Aortic atherosclerosis. Electronically Signed   By: Trudie Reed M.D.   On: 12/15/2016 08:00   ASSESSMENT AND PLAN:  83 year old female with severe Alzheimer's dementia who presents with acute encephalopathy and acute hypoxic respiratory failure with sepsis.  1. Acute hypoxia respiratory failure due to Acute bronchtis and e coli UTI Continue  Zosyn And add azithromycin to cover atypical.---change to po ceftin and zithromax DC vancomycin. since MRSA PCR negative  Blood cultures so far negative  Speech consult for risk of aspiration  2. Sepsis with hypotension: Patient presents with hypotension and tachycardia in the setting of pneumonia. Received V fluids Repeat lactic acid improving Sepsis resolved  3. Acute encephalopathy in the setting of problem #1 and 2 and severe  Alzheimer's dementia: Continue to reorient patient Avoid benzodiazepines if possible  4. Acute kidney injury: Hold nephrotoxic agents Received  IV fluids Improving creatinine  5. History of atrial fibrillation - resumed BB  6. History of CHF:  Patient not in heart failure Echo done results pending  Pt much improved. CSW for d/c planning Pt is ready for d/c  Case discussed with Care Management/Social Worker. Management plans discussed with the patient, family and they are in agreement.  CODE STATUS: DNR DVT Prophylaxis: lovenox  TOTAL TIME TAKING CARE OF THIS PATIENT:40 minutes.  >50% time spent on counselling and coordination of care  POSSIBLE D/C IN 1-2 DAYS, DEPENDING ON CLINICAL CONDITION.  Note: This dictation was prepared with Dragon dictation along with smaller phrase technology. Any transcriptional errors that result from this process are unintentional.  Zakkiyya Barno M.D on 12/16/2016 at 11:59 AM  Between 7am to 6pm - Pager - [phone]  After 6pm go to [email or web address] - password [email or web address] [facility]  [facility]  Office  [phone]  CC: Primary care physician; No PCP Per Patient

## 2016-12-17 MED ORDER — ENSURE ENLIVE PO LIQD
237.0000 mL | Freq: Two times a day (BID) | ORAL | Status: DC
  Administered 2016-12-19 – 2016-12-20 (×3): 237 mL via ORAL

## 2016-12-17 MED ORDER — IPRATROPIUM-ALBUTEROL 0.5-2.5 (3) MG/3ML IN SOLN
3.0000 mL | Freq: Three times a day (TID) | RESPIRATORY_TRACT | Status: DC
  Administered 2016-12-18 – 2016-12-20 (×8): 3 mL via RESPIRATORY_TRACT
  Filled 2016-12-17 (×8): qty 3

## 2016-12-17 MED ORDER — CEFUROXIME AXETIL 500 MG PO TABS: 500.0000 mg | ORAL_TABLET | Freq: Every day | ORAL | Status: DC

## 2016-12-17 NOTE — Progress Notes (Signed)
At 2110, pt's O2 sats were at 76%.  [place] at 6L was not effective at that time.  Non rebreather applied until O2 improved at 99%.  Back on [place] at 6L with sats at 93-94%.  Will continue to check throughout the night.

## 2016-12-17 NOTE — Progress Notes (Signed)
[facility] - [place] at [facility]   PATIENT NAME: Paula Summers    MR#:  [id number]  DATE OF BIRTH:  1931-11-25  SUBJECTIVE:   Came in with family group home with fever or chills not feeling well.  Some cough but unable to bring up phlegm Patient pleasantly confused.   REVIEW OF SYSTEMS:   Review of Systems  Unable to perform ROS: Dementia   Tolerating Diet:yes Tolerating PT: recommends SNF but family requesting for group home with PT  DRUG ALLERGIES:   Allergies  Allergen Reactions  . Penicillins Hives    VITALS:  Blood pressure (!) 148/76, pulse 93, temperature 97.7 F (36.5 C), temperature source Oral, resp. rate 17, height 5\' 1"  (1.549 m), weight 65.8 kg (145 lb), SpO2 100 %.  PHYSICAL EXAMINATION:   Physical Exam  GENERAL:  83 y.o.-year-old patient lying in the bed with no acute distress.  EYES: Pupils equal, round, reactive to light and accommodation. No scleral icterus. Extraocular muscles intact.  HEENT: Head atraumatic, normocephalic. Oropharynx and nasopharynx clear.  NECK:  Supple, no jugular venous distention. No thyroid enlargement, no tenderness.  LUNGS: Normal breath sounds bilaterally, no wheezing, rales, rhonchi. No use of accessory muscles of respiration.  CARDIOVASCULAR: S1, S2 normal. No murmurs, rubs, or gallops.  ABDOMEN: Soft, nontender, nondistended. Bowel sounds present. No organomegaly or mass.  EXTREMITIES: No cyanosis, clubbing or edema b/l.    NEUROLOGIC: Grossly nonfocal unable to assess secondary to dementia PSYCHIATRIC:  patient is alert  SKIN: No obvious rash, lesion, or ulcer.   LABORATORY PANEL:  CBC  Recent Labs Lab 12/15/16 0456  WBC 7.6  HGB 10.1*  HCT 30.6*  PLT 192    Chemistries   Recent Labs Lab 12/14/16 1008 12/15/16 0456  NA 137 140  K 4.9 3.8  CL 104 108  CO2 20* 21*  GLUCOSE 122* 89  BUN 45* 51*  CREATININE 1.72* 1.61*  CALCIUM 8.5* 7.8*  AST 51*  --   ALT 27  --    ALKPHOS 69  --   BILITOT 0.7  --    Cardiac Enzymes No results for input(s): TROPONINI in the last 168 hours. RADIOLOGY:  No results found. ASSESSMENT AND PLAN:  83 year old female with severe Alzheimer's dementia who presents with acute encephalopathy and acute hypoxic respiratory failure with sepsis.  1. Acute hypoxia respiratory failure due to Acute bronchtis and e coli UTI Continue  Zosyn And add azithromycin to cover atypical.---changed to po ceftin and zithromax DC vancomycin. since MRSA PCR negative Blood cultures so far negative  Speech consult for risk of aspiration Wean oxygen as tolerated  2. Sepsis with hypotension: Patient presents with hypotension and tachycardia in the setting of pneumonia. Received IV fluids Repeat lactic acid improving Sepsis resolved  3. Acute encephalopathy in the setting of problem #1 and 2 and severe Alzheimer's dementia: Continue to reorient patient Avoid benzodiazepines if possible  4. Acute kidney injury: Hold nephrotoxic agents Received  IV fluids Improving creatinine  5. History of atrial fibrillation - resumed BB  6. History of CHF:  Patient not in heart failure Echo done results pending  Pt much improved. CSW for d/c planning Pt is ready for d/c pending insurance authrorizaiton  Case discussed with Care Management/Social Worker. Management plans discussed with the patient, family and they are in agreement.  CODE STATUS: DNR DVT Prophylaxis: lovenox  TOTAL TIME TAKING CARE OF THIS PATIENT:40 minutes.  >50% time spent on counselling and coordination of care  Pt is ready for insurnace pending insurance authorization  Note: This dictation was prepared with Dragon dictation along with smaller phrase technology. Any transcriptional errors that result from this process are unintentional.  Nalu Troublefield M.D on 12/17/2016 at 7:36 AM  Between 7am to 6pm - Pager - [phone]  After 6pm go to [email or web address] - password [email or web address]  [facility]  [place] [facility]  Office  [phone]  CC: Primary care physician; No PCP Per Patient

## 2016-12-17 NOTE — Progress Notes (Signed)
Physical Therapy Treatment Patient Details Name: Paula Summers MRN: [id number] DOB: [birth year] Today's Date: 12/17/2016    History of Present Illness 83 yo female with onset of sepsis from UTI and HCAP, now admitted from family care home.  PMHx:  dementia, a-fib, hypothyroidism, glaucoma, CHF,     PT Comments    Pt still appearing lethargic but much more participatory this session.  Pt able to stand x3 trials with RW and 2 assist.  Pt pleasant and participating in session but demonstrated difficulty initiating movement which complicated ability to initiate ambulation with RW.  Per SW pt was ambulatory with RW household distances with supervision prior to admission.  Will continue to progress pt with strengthening, balance, transfers, and pre-gait activities (progressing to ambulation) as able.   Follow Up Recommendations  SNF     Equipment Recommendations  Rolling walker with 5" wheels    Recommendations for Other Services       Precautions / Restrictions Precautions Precautions: Fall Precaution Comments: Aspiration Restrictions Weight Bearing Restrictions: No    Mobility  Bed Mobility Overal bed mobility: Needs Assistance Bed Mobility: Supine to Sit;Sit to Supine     Supine to sit: Max assist Sit to supine: Max assist   General bed mobility comments: assist for trunk and B LE's; cueing and assist to initiate movement  Transfers Overall transfer level: Needs assistance Equipment used: Rolling walker (2 wheeled) Transfers: Sit to/from Stand Sit to Stand: Min assist;Mod assist;+2 physical assistance         General transfer comment: vc's and assist for foot and hand placement; vc's for upright posture (forward shoulders and hips posterior otherwise); x3 trials  Ambulation/Gait             General Gait Details: not appropriate at this time (pt unable to march in place in sitting or standing with cueing)   Stairs            Wheelchair Mobility    Modified Rankin (Stroke Patients Only)       Balance Overall balance assessment: Needs assistance Sitting-balance support: Bilateral upper extremity supported;Feet supported Sitting balance-Leahy Scale: Poor Sitting balance - Comments: Initially posterior lean but improved to close SBA once positioned; pt demonstrating R lean with head laterally side-bent to R requiring cueing to correct   Standing balance support: Bilateral upper extremity supported (on RW) Standing balance-Leahy Scale: Poor Standing balance comment: Posterior lean in standing with B LE's supported on bed requiring assist for balance                    Cognition Arousal/Alertness: Lethargic Behavior During Therapy: Flat affect Overall Cognitive Status: No family/caregiver present to determine baseline cognitive functioning                      Exercises Total Joint Exercises Heel Slides: AAROM;Strengthening;Both;10 reps;Supine  Vc's and tactile cues required for technique.    General Comments  Pt agreeable to PT session and nursing present end of session.      Pertinent Vitals/Pain Pain Assessment: Faces Faces Pain Scale: No hurt  Vitals (HR and O2 on 4 L) stable and WFL throughout treatment session.    Home Living                      Prior Function            PT Goals (current goals can now be found in the care plan section) Acute  Rehab PT Goals Patient Stated Goal: pt did not state any goals PT Goal Formulation: Patient unable to participate in goal setting Progress towards PT goals: Progressing toward goals    Frequency    Min 2X/week      PT Plan Current plan remains appropriate    Co-evaluation             End of Session Equipment Utilized During Treatment: Gait belt;Oxygen (4 L/min O2 via nasal cannula) Activity Tolerance: Patient limited by fatigue;Patient limited by lethargy Patient left: in bed;with call bell/phone within reach;with bed alarm  set     Time: 1610-9604 PT Time Calculation (min) (ACUTE ONLY): 23 min  Charges:  $Therapeutic Exercise: 8-22 mins $Therapeutic Activity: 8-22 mins                    G CodesHendricks Limes 01-16-2017, 11:58 AM Hendricks Limes, PT [phone]

## 2016-12-17 NOTE — Progress Notes (Signed)
PHARMACY NOTE:  ANTIMICROBIAL RENAL DOSAGE ADJUSTMENT  Current antimicrobial regimen includes a mismatch between antimicrobial dosage and estimated renal function.  As per policy approved by the Pharmacy & Therapeutics and Medical Executive Committees, the antimicrobial dosage will be adjusted accordingly.  Current antimicrobial dosage:  Cefuroxime 500mg  po BID  Indication: UTI  Renal Function:  Estimated Creatinine Clearance: 23 mL/min (by C-G formula based on SCr of 1.61 mg/dL (H)).     Antimicrobial dosage has been changed to:   Cefuroxime 500mg  PO Q24h for Crcl 10-30 ml/min    Thank you for allowing pharmacy to be a part of this patient's care.  Suzanne Kho A, [facility]PH 12/17/2016 9:51 AM

## 2016-12-17 NOTE — Progress Notes (Signed)
Initial Nutrition Assessment  DOCUMENTATION CODES:   Not applicable  INTERVENTION:  Provide Ensure Enlive po BID, each supplement provides 350 kcal and 20 grams of protein.  Patient requires full assist with meals.  NUTRITION DIAGNOSIS:   Inadequate oral intake related to dysphagia, chronic illness (Alzheimer's) as evidenced by other (see comment) (per chart).  GOAL:   Patient will meet greater than or equal to 90% of their needs  MONITOR:   PO intake, Supplement acceptance, Labs, Weight trends, I & O's  REASON FOR ASSESSMENT:   Consult Poor PO  ASSESSMENT:   83 year old female with severe Alzheimer's dementia who presents with acute encephalopathy and acute hypoxic respiratory failure with sepsis.   -Patient ready for d/c pending insurance authorization.  Attempted to speak with patient at bedside, but she was unable to participate in conversation. No family at bedside. Discussed with RN who reports patient eats slowly, but is able to eat a good portion of most meals. Per chart patient with several meals of 100% completion. Also noted weight has been stable per chart. Patient is full assist with meals and per chart can have difficulty swallowing with gulping or drinking.  Meal Completion: 25-100% per chart  Medications reviewed and include: azithromycin, Lasix 10 mg daily, levothyroxine, potassium chloride 10 mEq daily, NS @ 20 ml/hr.  Labs reviewed: CO2 21, BUN 51, Creatinine 1.61.   Nutrition-Focused physical exam completed. Findings are no fat depletion, mild muscle depletion noted only in temporal region, and no edema.   Diet Order:  Diet - low sodium heart healthy DIET DYS 2 Room service appropriate? Yes with Assist; Fluid consistency: Thin  Skin:  Reviewed, no issues  Last BM:  12/16/2016  Height:   Ht Readings from Last 1 Encounters:  12/15/16 5\' 1"  (1.549 m)    Weight:   Wt Readings from Last 1 Encounters:  12/17/16 145 lb (65.8 kg)    Ideal Body  Weight:  47.7 kg  BMI:  Body mass index is 27.4 kg/m.  Estimated Nutritional Needs:   Kcal:  1200-1400  Protein:  65-80 grams  Fluid:  >/= 1.6 L/day  EDUCATION NEEDS:   No education needs identified at this time  Helane RimaLeanne Jaylynn Siefert, MS, RD, LDN Pager: [phone] After Hours Pager: [phone]

## 2016-12-17 NOTE — Progress Notes (Signed)
Speech Language Pathology Treatment: Dysphagia  Patient Details Name: Paula Summers MRN: [id number] DOB: [birth year] Today's Date: 12/17/2016 Time: 1000-1040 SLP Time Calculation (min) (ACUTE ONLY): 40 min  Assessment / Plan / Recommendation Clinical Impression  Pt appeared to adequately tolerate trials of thin liquids via cup and trials of puree w/ no immediate, overt s/s of aspiration noted following strict aspiration precautions. Min distraction and increased WOB noted d/t the exertion during po trials but rest breaks were given to help manage this.    HPI HPI: Pt is a 83 y.o. female with a known history of Severe dementia, atrial fibrillation and hypothyroidism who [facility] with her daughter for above complaint. Patient was seen by neurologist on Tuesday for routine follow-up and the daughter was concerned for UTI so urine was checked and she was diagnosed with UTI. She was prescribed ciprofloxacin which she has been taking since Wednesday. This morning the group home called the daughter due to patient being lethargic, having chills, and worsening confusion on baseline severe dementia. In the emergency room chest x-ray shows possible pneumonia and urine analysis does not show evidence of UTI. Her blood pressure systolic has been in the 80s to 90s. She has received IV fluids. Daughter has noted pt can easily become "choked" when "gulping and drinking" - "she does better if I let her only drink small sips at a time". Pt presented w/ confusion and responded to general greeting only - delayed. She required verbal cues and support for all tasks.       SLP Plan  Continue with current plan of care     Recommendations  Diet recommendations: Dysphagia 2 (fine chop);Thin liquid Liquids provided via: Cup;No straw (if any coughing noted) Medication Administration: Crushed with puree Supervision: Full supervision/cueing for compensatory strategies;Staff to  assist with self feeding Compensations: Minimize environmental distractions;Slow rate;Small sips/bites;Lingual sweep for clearance of pocketing;Multiple dry swallows after each bite/sip;Follow solids with liquid Postural Changes and/or Swallow Maneuvers: Seated upright 90 degrees;Upright 30-60 min after meal                General recommendations:  (Dietician f/u) Oral Care Recommendations: Oral care BID;Staff/trained caregiver to provide oral care Follow up Recommendations: Skilled Nursing facility Plan: Continue with current plan of care       GO                 Jerilynn SomKatherine Damaso Laday, MS, CCC-SLP [place] 12/17/2016, 2:55 PM

## 2016-12-17 NOTE — Evaluation (Signed)
Occupational Therapy Evaluation Patient Details Name: Paula Summers MRN: [id number] DOB: March 20, 1931 Today's Date: 12/17/2016    History of Present Illness 83 yo female with onset of sepsis from UTI and HCAP, now admitted from family care home.  PMHx:  dementia, a-fib, hypothyroidism, glaucoma, CHF,    Clinical Impression   Pt seen for OT evaluation and was sitting up in bed alert but not oriented to anything but name. She wears bifocals and has a hx of glaucoma but full vision assessment was not able to be completed due to decreased cognition.  She was able to move both UEs and hands but not able to determine which hand was dominant.  She was cooperative with eval but needs extra time for all tasks and requires total assist for all ADLs at this time.  Rec OT continue with simple step tasks such as self feeding and grooming skills and incorporate UB and LB dressing tasks as tolerated since she is 2 person assist.  Rec SNF after discharge.    Follow Up Recommendations  SNF    Equipment Recommendations       Recommendations for Other Services       Precautions / Restrictions Precautions Precautions: Fall Precaution Comments: Aspiration Restrictions Weight Bearing Restrictions: No      Mobility Bed Mobility Overal bed mobility: Needs Assistance Bed Mobility: Supine to Sit;Sit to Supine     Supine to sit: Max assist Sit to supine: Max assist   General bed mobility comments: assist for trunk and B LE's; cueing and assist to initiate movement  Transfers Overall transfer level: Needs assistance Equipment used: Rolling walker (2 wheeled) Transfers: Sit to/from Stand Sit to Stand: Min assist;Mod assist;+2 physical assistance         General transfer comment: vc's and assist for foot and hand placement; vc's for upright posture (forward shoulders and hips posterior otherwise); x3 trials    Balance Overall balance assessment: Needs assistance Sitting-balance  support: Bilateral upper extremity supported;Feet supported Sitting balance-Leahy Scale: Poor Sitting balance - Comments: Initially posterior lean but improved to close SBA once positioned   Standing balance support: Bilateral upper extremity supported (on RW) Standing balance-Leahy Scale: Poor Standing balance comment: Posterior lean in standing with B LE's supported on bed requiring assist for balance                            ADL Overall ADL's : Needs assistance/impaired Eating/Feeding: Total assistance;Set up   Grooming: Wash/dry hands;Wash/dry face;Oral care;Set up;Maximal assistance Grooming Details (indicate cue type and reason): slow responses and needs hand over hand to intiiate and cues to stay on task  Upper Body Bathing: Total assistance   Lower Body Bathing: Total assistance   Upper Body Dressing : Total assistance   Lower Body Dressing: Total assistance                       Vision     Perception     Praxis      Pertinent Vitals/Pain Pain Assessment: No/denies pain Faces Pain Scale: No hurt     Hand Dominance     Extremity/Trunk Assessment Upper Extremity Assessment Upper Extremity Assessment: Generalized weakness;Difficult to assess due to impaired cognition   Lower Extremity Assessment Lower Extremity Assessment: Defer to PT evaluation       Communication Communication Communication: Expressive difficulties   Cognition Arousal/Alertness: Awake/alert Behavior During Therapy: Flat affect Overall Cognitive Status:  No family/caregiver present to determine baseline cognitive functioning                     General Comments       Exercises       Shoulder Instructions      Home Living Family/patient expects to be discharged to:: Group home                                 Additional Comments: family care home with no PLOF on chart      Prior Functioning/Environment Level of Independence: Needs  assistance    ADL's / Homemaking Assistance Needed: family care home to provide meals and cleaning            OT Problem List: Decreased strength;Decreased activity tolerance;Decreased cognition;Impaired balance (sitting and/or standing)   OT Treatment/Interventions: Self-care/ADL training;Patient/family education;Therapeutic activities;Balance training    OT Goals(Current goals can be found in the care plan section) Acute Rehab OT Goals Patient Stated Goal: pt unable to state any goals ADL Goals Pt Will Perform Eating: with set-up;with min assist;sitting Pt Will Perform Grooming: with min assist;sitting Pt Will Perform Upper Body Dressing: with set-up;with max assist;standing (2 person assist and no LOB standing)  OT Frequency: Min 1X/week   Barriers to D/C:            Co-evaluation              End of Session Nurse Communication: Other (comment) (ADL status for feeding and dressing--total assist)  Activity Tolerance: Patient tolerated treatment well Patient left: in bed;with call bell/phone within reach;with bed alarm set   Time: 1430-1502 OT Time Calculation (min): 32 min Charges:  OT General Charges $OT Visit: 1 Procedure OT Evaluation $OT Eval Moderate Complexity: 1 Procedure OT Treatments $Self Care/Home Management : 8-22 mins G-Codes:    Susanne Borders, OTR/L ascom [phone] 12/17/16, 3:29 PM

## 2016-12-18 ENCOUNTER — Inpatient Hospital Stay: Payer: Medicare HMO

## 2016-12-18 LAB — PROCALCITONIN: Procalcitonin: <0.1 ng/mL

## 2016-12-18 LAB — CREATININE, SERUM: Creatinine, Ser: 0.7 mg/dL (ref 0.44–1.00)

## 2016-12-18 LAB — BRAIN NATRIURETIC PEPTIDE: B Natriuretic Peptide: 713 pg/mL — ABNORMAL HIGH (ref 0.0–100.0)

## 2016-12-18 MED ORDER — CEFUROXIME AXETIL 500 MG PO TABS
500.0000 mg | ORAL_TABLET | Freq: Two times a day (BID) | ORAL | Status: DC
  Administered 2016-12-19 – 2016-12-20 (×3): 500 mg via ORAL
  Filled 2016-12-18 (×4): qty 1

## 2016-12-18 MED ORDER — FUROSEMIDE 10 MG/ML IJ SOLN
INTRAMUSCULAR | Status: AC
  Filled 2016-12-18: qty 4

## 2016-12-18 MED ORDER — METOPROLOL TARTRATE 5 MG/5ML IV SOLN: 5.0000 mg | Freq: Once | INTRAVENOUS | Status: DC

## 2016-12-18 MED ORDER — FUROSEMIDE 10 MG/ML IJ SOLN
40.0000 mg | Freq: Once | INTRAMUSCULAR | Status: AC
  Administered 2016-12-18: 40 mg via INTRAVENOUS
  Filled 2016-12-18: qty 4

## 2016-12-18 MED ORDER — ENOXAPARIN SODIUM 40 MG/0.4ML ~~LOC~~ SOLN
40.0000 mg | SUBCUTANEOUS | Status: DC
  Administered 2016-12-18 – 2016-12-19 (×2): 40 mg via SUBCUTANEOUS
  Filled 2016-12-18: qty 0.4

## 2016-12-18 MED ORDER — CEFUROXIME AXETIL 500 MG PO TABS: 250.0000 mg | ORAL_TABLET | Freq: Two times a day (BID) | ORAL | Status: DC

## 2016-12-18 NOTE — Progress Notes (Signed)
[facility] [place] [facility]   PATIENT NAME: Paula Summers    MR#:  [id number]  DATE OF BIRTH:  01-Oct-1931  SUBJECTIVE:   reamins on oxygen, breathing thru mouth, dter int he room, sats dropps in the 70-80's Patient pleasantly confused.  No tachypnea. Sleepy but alert when call hr name dter feels she is getting tierd and "is is ready to go?"  REVIEW OF SYSTEMS:   Review of Systems  Unable to perform ROS: Dementia   Tolerating Diet:yes Tolerating PT: recommends SNF but family requesting for group home with PT  DRUG ALLERGIES:   Allergies  Allergen Reactions  . Penicillins Hives    VITALS:  Blood pressure 122/64, pulse 82, temperature 97.7 F (36.5 C), temperature source Oral, resp. rate 16, height 5\' 1"  (1.549 m), weight 65.6 kg (144 lb 9.6 oz), SpO2 94 %.  PHYSICAL EXAMINATION:   Physical Exam  GENERAL:  83 y.o.-year-old patient lying in the bed with no acute distress. sleepy EYES: Pupils equal, round, reactive to light and accommodation. No scleral icterus. Extraocular muscles intact.  HEENT: Head atraumatic, normocephalic. Oropharynx and nasopharynx clear.  NECK:  Supple, no jugular venous distention. No thyroid enlargement, no tenderness.  LUNGS decreased  breath sounds bilaterally, no wheezing,  -bibasilar rales, no rhonchi. No use of accessory muscles of respiration.  CARDIOVASCULAR: S1, S2 normal. No murmurs, rubs, or gallops.  ABDOMEN: Soft, nontender, nondistended. Bowel sounds present. No organomegaly or mass.  EXTREMITIES: No cyanosis, clubbing or edema b/l.    NEUROLOGIC: Grossly nonfocal unable to assess secondary to dementia PSYCHIATRIC:  patient is alert  SKIN: No obvious rash, lesion, or ulcer.   LABORATORY PANEL:  CBC  Recent Labs Lab 12/15/16 0456  WBC 7.6  HGB 10.1*  HCT 30.6*  PLT 192    Chemistries   Recent Labs Lab 12/14/16 1008 12/15/16 0456 12/18/16 0425  NA 137 140  --   K 4.9 3.8  --    CL 104 108  --   CO2 20* 21*  --   GLUCOSE 122* 89  --   BUN 45* 51*  --   CREATININE 1.72* 1.61* 0.70  CALCIUM 8.5* 7.8*  --   AST 51*  --   --   ALT 27  --   --   ALKPHOS 69  --   --   BILITOT 0.7  --   --    Cardiac Enzymes No results for input(s): TROPONINI in the last 168 hours. RADIOLOGY:  Dg Chest Port 1 View  Result Date: 12/18/2016 CLINICAL DATA:  Shortness of Breath EXAM: PORTABLE CHEST 1 VIEW COMPARISON:  December 15, 2016 and December 22, 2013 FINDINGS: Chronic appearing diffuse interstitial fibrosis is felt to be present and stable from recent study but progressed since 2015. There is no airspace consolidation. There is an apparent small pleural effusion on the right. Heart size and pulmonary vascularity are normal. There is atherosclerotic calcification in the aorta and common carotid arteries, stable. No adenopathy evident. Bones are osteoporotic. IMPRESSION: Chronic interstitial lung disease, progressed since 2015. Small right pleural effusion. No airspace consolidation. Stable cardiac silhouette. Extensive vascular atherosclerotic calcification. Electronically Signed   By: Bretta BangWilliam  Woodruff III M.D.   On: 12/18/2016 09:38   ASSESSMENT AND PLAN:  83 year old female with severe Alzheimer's dementia who presents with acute encephalopathy and acute hypoxic respiratory failure with sepsis.  1. Acute hypoxia respiratory failure due to Acute bronchtis and e coli UTI Continue  Zosyn And  add azithromycin to cover atypical.---changed to po ceftin  DC vancomycin. since MRSA PCR negative Blood cultures so far negative  Speech consult for risk of aspiration Wean oxygen as tolerated.  2. Acute on chronic CHF, diastolic with COPD flare -(h/o smoking in the past) -sats dropping to as low as 70's---gave IV lasix 40 mg x1 -now on venti mask -ECHO shows EF 55-60%  3. Acute encephalopathy in the setting of problem #1 and 2 and severe Alzheimer's dementia: Continue to reorient  patient Avoid benzodiazepines if possible  4. Acute kidney injury: Hold nephrotoxic agents Received  IV fluids Improving creatinine  5. History of atrial fibrillation - resumed BB  Pt's dter in the room understands overall long term poor prognosis She doees not want any aggressive w/u done. She tells me her mother is" tired and ready to go". Will aske her if she wants Palliative care to see her  Case discussed with Care Management/Social Worker. Management plans discussed with the patient, family and they are in agreement.  CODE STATUS: DNR DVT Prophylaxis: lovenox  TOTAL critical timeTIME TAKING CARE OF THIS PATIENT:40 minutes.  >50% time spent on counselling and coordination of care  Pt is ready for insurnace pending insurance authorization  Note: This dictation was prepared with Dragon dictation along with smaller phrase technology. Any transcriptional errors that result from this process are unintentional.  Yoltzin Barg M.D on 12/18/2016 at 10:04 AM  Between 7am to 6pm - Pager - [phone]  After 6pm go to [email or web address] - password [email or web address] [facility]  [facility]  Office  [phone]  CC: Primary care physician; No PCP Per Patient

## 2016-12-18 NOTE — Progress Notes (Signed)
Occupational Therapy Treatment Patient Details Name: Paula Summers MRN: [id number] DOB: 1931/04/14 Today's Date: 12/18/2016    History of present illness 83 yo female with onset of sepsis from UTI and HCAP, now admitted from family care home.  PMHx:  dementia, a-fib, hypothyroidism, glaucoma, CHF,    OT comments  Pt tolerated session well, focused on self feeding and grooming tasks. Pt able to wash face and self feed applesauce with spoon with supervision after set up and head of bed adjusted for appropriate upright positioning to minimize risk of aspiration. Pt alert, pleasantly confused during session, able to follow 1-2 step instructions with minimal verbal cues. Reported session progress and pt alertness to nurse. Pt continues to benefit from skilled OT services to address noted impairments and maximize functional independence with ADLs.   Follow Up Recommendations  SNF    Equipment Recommendations       Recommendations for Other Services      Precautions / Restrictions Precautions Precautions: Fall Precaution Comments: Aspiration Restrictions Weight Bearing Restrictions: No       Mobility Bed Mobility                  Transfers                      Balance                                   ADL Overall ADL's : Needs assistance/impaired Eating/Feeding: Set up;Minimal assistance;Sitting Eating/Feeding Details (indicate cue type and reason): Pt tolerated self feeding of small spoonfuls of applesauce with set up and supervision after OT adjusted head of bed to provide more upright positioning to prevent aspiration Grooming: Wash/dry face;Bed level;Set up;Supervision/safety Grooming Details (indicate cue type and reason): With verbal cues and set up, pt washed face requiring only supervision and verbal cue to initiate                                      Vision                     Perception     Praxis      Cognition   Behavior During Therapy: Flat affect Overall Cognitive Status: History of cognitive impairments - at baseline                  General Comments: Pt more alert than yesterday, pleasantly confused    Extremity/Trunk Assessment               Exercises     Shoulder Instructions       General Comments      Pertinent Vitals/ Pain       Pain Assessment: No/denies pain  Home Living                                          Prior Functioning/Environment              Frequency  Min 1X/week        Progress Toward Goals  OT Goals(current goals can now be found in the care plan section)  Progress towards OT goals:  (pt progressing slowly with improving alertness and decreased  assistance required for self feeding and grooming tasks)  Acute Rehab OT Goals Patient Stated Goal: pt unable to state any goals  Plan Frequency remains appropriate;Discharge plan remains appropriate    Co-evaluation                 End of Session     Activity Tolerance Patient tolerated treatment well   Patient Left in bed;with call bell/phone within reach;with bed alarm set   Nurse Communication Other (comment) (spoke to RN re: feeding status, alertness for meds)        Time: 4098-1191 OT Time Calculation (min): 20 min  Charges: OT General Charges $OT Visit: 1 Procedure OT Treatments $Self Care/Home Management : 8-22 mins  Eliezer Bottom, OTR/L 12/18/2016, 4:39 PM

## 2016-12-18 NOTE — Progress Notes (Signed)
Nurse entered room to check vitals prior to AM medication administration. Patient Blood pressure low and O2 sats were low on 2liters, patient bumped up to 6 liters and found to still be sating in the high 70's to mid 80's. Patient appearing to be more lethargic than she was yesterday per report of daughter who is at bedside. MD- Dr. Enedina FinnerSona Patel and Resp Therapy- to bedside to assess. Patient given 40mg  of IV Lasix and placed on Venti Mask at 40%- 02 sats improved to 95%.  Patient still quite lethargic, afebrile. Per MD, hold all AM meds until Lasix able to take affect. Will continue to monitor.

## 2016-12-18 NOTE — Progress Notes (Signed)
Dr. Allena KatzPatel paged and update with patient status and patient currently on 6 liters and O2 sats are maintaining in the 80's with HR fluctuating from 105-130's/140's. Patient does not appear to be in any distress and upon observation is still and resting with desaturation. Resp, Angie called and patient placed back on 40% Venti mask and patient's sats up in the  90%'s per report. MD gave one time order for IV 5mg  IV metoprolol for elevated HR. MD to talk with patient's family regarding plan of care. Will continue to monitor.

## 2016-12-18 NOTE — Progress Notes (Signed)
Dr Allena KatzPatel back in to assess patient. Patient still on 40% Venti Mask- O2 sats maintaining WNL. Patient still Lethargic- afebrile. Resp paged to attempt to wean patient down. Meds still on hold at this time as patient is a high aspiration risk- Ok'ed per Dr. Allena KatzPatel. Will continue to monitor.

## 2016-12-18 NOTE — Progress Notes (Signed)
RT called to room to assess for hypoxia.  Pulse ox had some difficulty picking up sat accurately.  She has a history of A-fib, O2 sat mid 80's on 6LPM Strong, RT placed patient on 40% Venti-mask, O2 sats up to 94%.  Will continue to monitor.  Dr. Allena KatzPatel at bedside.

## 2016-12-18 NOTE — Plan of Care (Signed)
Problem: Acute Rehab OT Goals (only OT should resolve) Goal: Pt. Will Perform Eating Outcome: Progressing Pt performed self feeding with set up and supervision while long sitting in bed. Pt would benefit from additional therapy to address goal and ensure consistency and safety

## 2016-12-18 NOTE — Progress Notes (Signed)
Enoxaparin   Patient qualifies for Enoxaparin 40 mg SQ daily based on CrCl >30 ml/min per policy. Will change to Enoxaparin 40 mg SQ daily.  Koty Anctil D. Brittni Hult, PharmD   

## 2016-12-18 NOTE — Progress Notes (Signed)
Patient still quite lethargic and confused,  Pt  attempting to pull venti mask off at times but able to be re-directed or mask re-applied by Nurse as patient being monitored by Telemonitor. PO meds still held at this time.  Dinner Tray left at bedside as concern for high aspiration risk and patient's respiratory status at this time. NO IV metroprolol given as patient 's vitals did not warrant medication administration at this time. Will continue to monitor.

## 2016-12-18 NOTE — Plan of Care (Signed)
Problem: SLP Dysphagia Goals Goal: Misc Dysphagia Goal Pt will safely tolerate po diet of least restrictive consistency w/ no overt s/s of aspiration noted by Staff/pt/family x3 sessions.    

## 2016-12-18 NOTE — Progress Notes (Signed)
Speech Language Pathology Treatment: Dysphagia (education re: precautions, food/liquid consistency w/ Dtr)  Patient Details Name: Paula Summers MRN: [id number] DOB: Jan 10, 1931 Today's Date: 12/18/2016 Time: [id number] SLP Time Calculation (min) (ACUTE ONLY): 35 min  Assessment / Plan / Recommendation Clinical Impression  Pt presented w/ min declined status today w/ a change in medical status requiring Ventimask for increased O2 support; lasix given by MD order. NSG also noted low BP. Upon entering room, pt appeared weak and lethargic - not appropriate for current diet consistency. Daughter present in room; pt awakened intermittently but appeared too weak and usually closed eyes again. Discussed w/ Daughter pt's increased risk for aspiration moreso now d/t the decline in her medical and pulmonary status' and recommended a modification in diet consistency at this time; strict aspiration precautions. Daughter agreed. Education given on diet/food consistencies; liquids consistencies of Nectar consistency. Discussed pt's risk for aspiration w/ Daughter and the diet consistency w/ the aspiration precautions to help reduce the risk. Recommend Pills given in Puree - CRUSHED for easier, safer swallowing. Daughter agreed; NSG updated. ST services will f/u w/ toleration of diet w/ objective assessment as indicated next 1-3 days.     HPI HPI: Pt is a 83 y.o. female with a known history of Severe dementia, atrial fibrillation and hypothyroidism who [facility] with her daughter for above complaint. Patient was seen by neurologist on Tuesday for routine follow-up and the daughter was concerned for UTI so urine was checked and she was diagnosed with UTI. She was prescribed ciprofloxacin which she has been taking since Wednesday. This morning the group home called the daughter due to patient being lethargic, having chills, and worsening confusion on baseline severe dementia.  In the emergency room chest x-ray shows possible pneumonia and urine analysis does not show evidence of UTI. Her blood pressure systolic has been in the 80s to 90s. She has received IV fluids. Daughter has noted pt can easily become "choked" when "gulping and drinking" - "she does better if I let her only drink small sips at a time". Pt presented w/ declined respiratory status at this time w/ need for O2 mask for increased O2 support. Daughter was present for education and poc discussion.       SLP Plan  Continue with current plan of care     Recommendations  Diet recommendations: Dysphagia 1 (puree);Nectar-thick liquid Liquids provided via: Cup;No straw Medication Administration: Crushed with puree Supervision: Full supervision/cueing for compensatory strategies;Staff to assist with self feeding Compensations: Minimize environmental distractions;Slow rate;Small sips/bites;Lingual sweep for clearance of pocketing;Multiple dry swallows after each bite/sip;Follow solids with liquid Postural Changes and/or Swallow Maneuvers: Seated upright 90 degrees;Upright 30-60 min after meal                General recommendations:  (Dietician) Oral Care Recommendations: Oral care BID;Staff/trained caregiver to provide oral care Follow up Recommendations: Skilled Nursing facility Plan: Continue with current plan of care       GO                Jerilynn SomKatherine Octavio Matheney, MS, CCC-SLP Zalma Channing 12/18/2016, 5:16 PM

## 2016-12-18 NOTE — Progress Notes (Signed)
PHARMACY NOTE:  ANTIMICROBIAL RENAL DOSAGE ADJUSTMENT  Current antimicrobial regimen includes a mismatch between antimicrobial dosage and estimated renal function.  As per policy approved by the Pharmacy & Therapeutics and Medical Executive Committees, the antimicrobial dosage will be adjusted accordingly.  Current antimicrobial dosage:  Cefuroxime 500mg  po BID  Indication: UTI/ Acute hypoxic respiratory failure due to acute bronchitis   Renal Function:  Estimated Creatinine Clearance: 46.2 mL/min (by C-G formula based on SCr of 0.7 mg/dL).     Antimicrobial dosage has been changed to:   Cefuroxime 500mg  PO BID    Thank you for allowing pharmacy to be a part of this patient's care.  Demetrius CharityJames,Zacarias Krauter D, [facility]PH 12/18/2016 8:31 AM

## 2016-12-18 NOTE — Progress Notes (Signed)
Patient educated on need for Telesitter as patient desaturating with oxygen on face and is pulling on mask and IV. Patient made aware that  Voice in the camera will remind her to leave her oxygen and IV in place. Patient's daughter Lynden AngCathy at bedside.

## 2016-12-19 LAB — CULTURE, BLOOD (ROUTINE X 2)
CULTURE: NO GROWTH
Culture: NO GROWTH

## 2016-12-19 MED ORDER — DIVALPROEX SODIUM 125 MG PO CSDR
250.0000 mg | DELAYED_RELEASE_CAPSULE | Freq: Two times a day (BID) | ORAL | Status: DC
  Administered 2016-12-20 – 2016-12-23 (×7): 250 mg via ORAL
  Filled 2016-12-19 (×7): qty 2

## 2016-12-19 MED ORDER — GUAIFENESIN 100 MG/5ML PO SOLN: 5.0000 mL | ORAL | Status: DC

## 2016-12-19 NOTE — Progress Notes (Signed)
Physical Therapy Treatment Patient Details Name: Paula Summers MRN: [id number] DOB: September 30, 1931 Today's Date: 12/19/2016    History of Present Illness 83 yo female with onset of sepsis from UTI and HCAP, now admitted from family care home.  PMHx:  dementia, a-fib, hypothyroidism, glaucoma, CHF,     PT Comments    Pt initially more awake and engaged. Agrees to session and attempts at bed mobility/sitting.  While doing exercises as described below, pt found to be incontinent.  Nursing in to assist with care.  Rolling left and right for care and bed change.  Attempted sitting edge of bed.  Pt initially agrees but then stated she was unable to once sidelying with le's off of the bed.  She made no effort to assist.  Returned to supine and positioned for comfort. Pt fatigued with activity and less engaged at end of session.   Follow Up Recommendations  SNF     Equipment Recommendations       Recommendations for Other Services       Precautions / Restrictions Precautions Precautions: Fall Precaution Comments: Aspiration Restrictions Weight Bearing Restrictions: No    Mobility  Bed Mobility Overal bed mobility: Needs Assistance Bed Mobility: Rolling Rolling: Mod assist         General bed mobility comments: attempted sitting edge of bed but pt with no effort to assist stating "I can't"  Transfers                    Ambulation/Gait             General Gait Details: not appropriate at this time (pt unable to march in place in sitting or standing with cueing)   Stairs            Wheelchair Mobility    Modified Rankin (Stroke Patients Only)       Balance                                    Cognition Arousal/Alertness: Awake/alert Behavior During Therapy: Flat affect Overall Cognitive Status: History of cognitive impairments - at baseline                 General Comments: initially engaged but fatigued quickly     Exercises Other Exercises Other Exercises: BLE AAROM x 10 for heel slides, ankle pumps, ab/adduction Other Exercises: pt inc loose bm/urine.  asissted nursing with rolling left and right for care.  pt unable to roll without assist but can hold sidelying position    General Comments        Pertinent Vitals/Pain Pain Assessment: No/denies pain    Home Living                      Prior Function            PT Goals (current goals can now be found in the care plan section)      Frequency    Min 2X/week      PT Plan Current plan remains appropriate    Co-evaluation             End of Session Equipment Utilized During Treatment: Oxygen Activity Tolerance: Patient limited by fatigue;Patient limited by lethargy Patient left: in bed;with call bell/phone within reach;with bed alarm set;with family/visitor present     Time: 1215-1242 PT Time Calculation (min) (ACUTE ONLY): 27 min  Charges:  $Therapeutic Exercise: 8-22 mins $Therapeutic Activity: 8-22 mins                    G Codes:      Danielle Dess 01-09-17, 12:56 PM

## 2016-12-19 NOTE — Plan of Care (Signed)
Problem: Education: Goal: Knowledge of [facility] Education information/materials will improve Outcome: Progressing Pt somnolent throughout shift.  PO meds not given d/t decreased LOC, unable to safely administer.  O2 sats maintained on Venti mask 40%.  Tele sitter in place, call bell within reach.  WCTM.

## 2016-12-19 NOTE — Clinical Social Work Note (Addendum)
CSW spoke to patient's daughter and Danny LawlessHCPOA Janice [phone] and Clydie BraunKaren owner of [facility] [phone].  Patient's daughter was trying to decide between having patient return to group home with home hospice, paying privately for SNF or home with home health.  Patient's daughter initially said hospice at home, but then after the daughter spoke with Clydie BraunKaren the group home supervisor, they would like to try home health PT first, and then if patient deteriorates they they will consider [facility].  Clydie BraunKaren said they would prefer [facility] for home health.  CSW updated case manager, that [facility] has not received insurance authorization from [employer] and patient's group home and daughter would like to try home health first.  Group home said they can accept patient back tomorrow if she is medically ready for discharge and orders have been received.  Patient will need EMS transport, CSW informed patient's daughter that insurance will be billed, so she may have a copay.  Patient's daughter is in agreement to having EMS transport patient due to not being able to walk and on oxygen when patient is medically ready for discharge and orders have been received,  . CSW to continue to follow and will update physician in the morning.  Ervin KnackEric R. Kaijah Abts, MSW, Theresia MajorsLCSWA [phone]  12/19/2016 4:01 PM

## 2016-12-19 NOTE — Progress Notes (Signed)
[facility] - [place] at [facility]   PATIENT NAME: Paula MansonJeanette Summers    MR#:  [id number]  DATE OF BIRTH:  [birth year]  SUBJECTIVE:   reamins on oxygen, breathing thru mouth, dter int he room, sats dropps in the 70-80's Patient pleasantly confused. More alert today. She is some lunch. No tachypnea.  Daughter at bedside.  REVIEW OF SYSTEMS:   Review of Systems  Unable to perform ROS: Dementia   Tolerating Diet:yes Tolerating PT: recommends SNF but family requesting for group home with PT  DRUG ALLERGIES:   Allergies  Allergen Reactions  . Penicillins Hives    VITALS:  Blood pressure 116/71, pulse (!) 102, temperature 98.5 F (36.9 C), resp. rate 15, height 5\' 1"  (1.549 m), weight 65.6 kg (144 lb 9.6 oz), SpO2 99 %.  PHYSICAL EXAMINATION:   Physical Exam  GENERAL:  83 y.o.-year-old patient lying in the bed with no acute distress.EYES: Pupils equal, round, reactive to light and accommodation. No scleral icterus. Extraocular muscles intact.  HEENT: Head atraumatic, normocephalic. Oropharynx and nasopharynx clear.  NECK:  Supple, no jugular venous distention. No thyroid enlargement, no tenderness.  LUNGS decreased  breath sounds bilaterally, no wheezing,  -bibasilar rales, no rhonchi. No use of accessory muscles of respiration.  CARDIOVASCULAR: S1, S2 normal. No murmurs, rubs, or gallops.  ABDOMEN: Soft, nontender, nondistended. Bowel sounds present. No organomegaly or mass.  EXTREMITIES: No cyanosis, clubbing or edema b/l.    NEUROLOGIC: Grossly nonfocal unable to assess secondary to dementia PSYCHIATRIC:  patient is alert  SKIN: No obvious rash, lesion, or ulcer.   LABORATORY PANEL:  CBC  Recent Labs Lab 12/15/16 0456  WBC 7.6  HGB 10.1*  HCT 30.6*  PLT 192    Chemistries   Recent Labs Lab 12/14/16 1008 12/15/16 0456 12/18/16 0425  NA 137 140  --   K 4.9 3.8  --   CL 104 108  --   CO2 20* 21*  --   GLUCOSE 122* 89  --   BUN  45* 51*  --   CREATININE 1.72* 1.61* 0.70  CALCIUM 8.5* 7.8*  --   AST 51*  --   --   ALT 27  --   --   ALKPHOS 69  --   --   BILITOT 0.7  --   --    Cardiac Enzymes No results for input(s): TROPONINI in the last 168 hours. RADIOLOGY:  Dg Chest Port 1 View  Result Date: 12/18/2016 CLINICAL DATA:  Shortness of Breath EXAM: PORTABLE CHEST 1 VIEW COMPARISON:  December 15, 2016 and December 22, 2013 FINDINGS: Chronic appearing diffuse interstitial fibrosis is felt to be present and stable from recent study but progressed since 2015. There is no airspace consolidation. There is an apparent small pleural effusion on the right. Heart size and pulmonary vascularity are normal. There is atherosclerotic calcification in the aorta and common carotid arteries, stable. No adenopathy evident. Bones are osteoporotic. IMPRESSION: Chronic interstitial lung disease, progressed since 2015. Small right pleural effusion. No airspace consolidation. Stable cardiac silhouette. Extensive vascular atherosclerotic calcification. Electronically Signed   By: Bretta BangWilliam  Woodruff III M.D.   On: 12/18/2016 09:38   ASSESSMENT AND PLAN:  83 year old female with severe Alzheimer's dementia who presents with acute encephalopathy and acute hypoxic respiratory failure with sepsis.  1. Acute hypoxia respiratory failure due to Acute bronchtis and e coli UTI Continue  Zosyn And add azithromycin to cover atypical.---changed to po ceftin  DC vancomycin. since MRSA  PCR negative Blood cultures so far negative  Speech consult for risk of aspiration Wean oxygen as tolerated.  2. Acute on chronic CHF, diastolic with COPD flare -(h/o smoking in the past) -sats dropping to as low as 70's---gave IV lasix 40 mg x1 -now on venti mask--- try to wean off oxygen if possible still requiring 5-6 L of oxygen and Ventimask intermittently -ECHO shows EF 55-60%  3. Acute encephalopathy in the setting of problem #1 and 2 and severe Alzheimer's  dementia: Continue to reorient patient Avoid benzodiazepines if possible  4. Acute kidney injury: Hold nephrotoxic agents Received  IV fluids Improving creatinine  5. History of atrial fibrillation - resumed BB  Pt's dter in the room understands overall long term poor prognosis She doees not want any aggressive w/u done. She tells me her mother is" tired and ready to go".   Child psychotherapist for discharge planning   Case discussed with Care Management/Social Worker. Management plans discussed with the patient, family and they are in agreement.  CODE STATUS: DNR DVT Prophylaxis: lovenox  TOTAL critical timeTIME TAKING CARE OF THIS PATIENT:30 minutes.  >50% time spent on counselling and coordination of care  Pt is ready for insurnace pending insurance authorization  Note: This dictation was prepared with Dragon dictation along with smaller phrase technology. Any transcriptional errors that result from this process are unintentional.  Bryson Palen M.D on 12/19/2016 at 3:29 PM  Between 7am to 6pm - Pager - [phone]  After 6pm go to [email or web address] - password [email or web address] [facility]  [place] [facility]  Office  [phone]  CC: Primary care physician; No PCP Per Patient

## 2016-12-20 DIAGNOSIS — G309 Alzheimer's disease, unspecified: Secondary | ICD-10-CM

## 2016-12-20 DIAGNOSIS — F02818 Dementia in other diseases classified elsewhere, unspecified severity, with other behavioral disturbance: Secondary | ICD-10-CM

## 2016-12-20 DIAGNOSIS — G308 Other Alzheimer's disease: Secondary | ICD-10-CM

## 2016-12-20 DIAGNOSIS — I509 Heart failure, unspecified: Secondary | ICD-10-CM

## 2016-12-20 DIAGNOSIS — Z515 Encounter for palliative care: Secondary | ICD-10-CM

## 2016-12-20 DIAGNOSIS — J9621 Acute and chronic respiratory failure with hypoxia: Secondary | ICD-10-CM

## 2016-12-20 DIAGNOSIS — F0281 Dementia in other diseases classified elsewhere with behavioral disturbance: Secondary | ICD-10-CM

## 2016-12-20 DIAGNOSIS — R0600 Dyspnea, unspecified: Secondary | ICD-10-CM

## 2016-12-20 MED ORDER — LORAZEPAM 0.5 MG PO TABS: 0.5000 mg | ORAL_TABLET | ORAL | Status: DC | PRN

## 2016-12-20 MED ORDER — MORPHINE SULFATE (CONCENTRATE) 10 MG/0.5ML PO SOLN: 2.5000 mg | ORAL | Status: DC | PRN

## 2016-12-20 MED ORDER — MORPHINE SULFATE (PF) 2 MG/ML IV SOLN
1.0000 mg | INTRAVENOUS | Status: DC | PRN
  Administered 2016-12-20: 16:00:00 1 mg via INTRAVENOUS
  Filled 2016-12-20: qty 1

## 2016-12-20 MED ORDER — IPRATROPIUM-ALBUTEROL 0.5-2.5 (3) MG/3ML IN SOLN: 3.0000 mL | Freq: Three times a day (TID) | RESPIRATORY_TRACT | Status: DC | PRN

## 2016-12-20 NOTE — Plan of Care (Signed)
Problem: Education: Goal: Knowledge of [facility] Education information/materials will improve Outcome: Progressing VSS, free of falls during shift.  Pt lethargic, able to arouse and answer questions.  RN attempted to administer PO meds, pt stated she would be able to remain awake and alert.  Pt unable to remain awake, PO meds not given.  No complaints overnight.  Bed in low position, bed alarm on.  Call bell within reach, WCTM.

## 2016-12-20 NOTE — Care Management Important Message (Signed)
Important Message  Patient Details  Name: Paula Summers MRN: [id number] Date of Birth: Dec 08, 1931   Medicare Important Message Given:  Yes    Gwenette GreetBrenda S Signe Tackitt, RN 12/20/2016, 8:33 AM

## 2016-12-20 NOTE — Progress Notes (Signed)
[facility] - [place] at [facility]   PATIENT NAME: Paula Summers    MR#:  [id number]  DATE OF BIRTH:  Nov 22, 1931  SUBJECTIVE:  CHIEF COMPLAINT:   Chief Complaint  Patient presents with  . Altered Mental Status   - On Ventimask at 40% FiO2. Patient alert and being fed at lunch this afternoon. Appears confused but pleasant.  REVIEW OF SYSTEMS:  Review of Systems  Unable to perform ROS: Dementia    DRUG ALLERGIES:   Allergies  Allergen Reactions  . Penicillins Hives    VITALS:  Blood pressure 140/81, pulse 89, temperature 97.8 F (36.6 C), temperature source Oral, resp. rate 16, height 5\' 1"  (1.549 m), weight 60.1 kg (132 lb 8 oz), SpO2 97 %.  PHYSICAL EXAMINATION:  Physical Exam  GENERAL:  83 y.o.-year-old patient lying in the bed with no acute distress.  EYES: Pupils equal, round, reactive to light and accommodation. No scleral icterus. Extraocular muscles intact.  HEENT: Head atraumatic, normocephalic. Oropharynx and nasopharynx clear.  NECK:  Supple, no jugular venous distention. No thyroid enlargement, no tenderness.  LUNGS: Normal breath sounds bilaterally, no wheezing, rales,rhonchi or crepitation. No use of accessory muscles of respiration. Decreased bibasilar breath sounds CARDIOVASCULAR: S1, S2 normal. No murmurs, rubs, or gallops.  ABDOMEN: Soft, nontender, nondistended. Bowel sounds present. No organomegaly or mass.  EXTREMITIES: No pedal edema, cyanosis, or clubbing.  NEUROLOGIC: Follows simple commands. No focal motor or sensory deficits. Global weakness noted. PSYCHIATRIC: The patient is alert and oriented to self.  SKIN: No obvious rash, lesion, or ulcer.    LABORATORY PANEL:   CBC  Recent Labs Lab 12/15/16 0456  WBC 7.6  HGB 10.1*  HCT 30.6*  PLT 192   ------------------------------------------------------------------------------------------------------------------  Chemistries   Recent Labs Lab 12/14/16 1008  12/15/16 0456 12/18/16 0425  NA 137 140  --   K 4.9 3.8  --   CL 104 108  --   CO2 20* 21*  --   GLUCOSE 122* 89  --   BUN 45* 51*  --   CREATININE 1.72* 1.61* 0.70  CALCIUM 8.5* 7.8*  --   AST 51*  --   --   ALT 27  --   --   ALKPHOS 69  --   --   BILITOT 0.7  --   --    ------------------------------------------------------------------------------------------------------------------  Cardiac Enzymes No results for input(s): TROPONINI in the last 168 hours. ------------------------------------------------------------------------------------------------------------------  RADIOLOGY:  No results found.  EKG:   Orders placed or performed during the hospital encounter of 12/18/15  . ED EKG  . ED EKG  . EKG 12-Lead  . EKG 12-Lead    ASSESSMENT AND PLAN:   83 year old female with severe Alzheimer's dementia who presents with acute encephalopathy and acute hypoxic respiratory failure with sepsis.  1. Acute hypoxia respiratory failure due to Acute bronchtis and e coli UTI IV ABX changed to oral ceftin Blood cultures so far negative  Speech consult for risk of aspiration Unable to be weaned off o2- fluctuating mental status - palliative care meeting today and would benefit from hospice  2. Acute on chronic CHF, diastolic with COPD flare -(h/o smoking in the past) -ECHO shows EF 55-60% - still on venti mask- prn lasix  3. Acute encephalopathy in the setting of problem #1 and 2 and severe Alzheimer's dementia: Fluctuating mental status- sleeps all day and sometimes more alert Avoid benzodiazepines if possible depakote for mood stabilization  4. Acute kidney injury: Hold  nephrotoxic agents Improving creatinine  5. History of atrial fibrillation - resumed BB  6. DVT prophylaxis- will be discontinued if transitioned to comfort care  If improves- group home with hospice, if worsens- hospice home Monitor over the weekend.   All the records are reviewed and  case discussed with Care Management/Social Workerr. Management plans discussed with the patient, family and they are in agreement.  CODE STATUS: DO NOT RESUSCITATE  TOTAL TIME TAKING CARE OF THIS PATIENT: 32 minutes.   POSSIBLE D/C IN 1-2 days, DEPENDING ON CLINICAL CONDITION.   Enid BaasKALISETTI,Latoshia Monrroy M.D on 12/20/2016 at 3:28 PM  Between 7am to 6pm - Pager - [phone]  After 6pm go to [email or web address] - Social research officer, governmentpassword [email or web address]  [facility] [place] [facility]  Office  [phone]  CC: Primary care physician; No PCP Per Patient

## 2016-12-20 NOTE — Clinical Social Work Note (Signed)
CSW was informed by [facility] that they did receive insurance authorization from [employer] for patient to go to SNF if she is still appropriate.  This CSW updated covering CSW about insurance authorization approval.    Ervin KnackEric R. Jovian Lembcke, MSW, LCSWA [phone]  12/20/2016 1:12 PM

## 2016-12-20 NOTE — [place] FL2 (Signed)
MEDICAID FL2 LEVEL OF CARE SCREENING TOOL     IDENTIFICATION  Patient Name: Paula Summers Birthdate: [birth year] Sex: female Admission Date (Current Location): 12/14/2016  [place] and [place] Number:  Chiropodist and Address:  [facility], [address], [place], [place] [address]      Provider Number: [id number]  Attending Physician Name and Address:  Enid Baas, MD  Relative Name and Phone Number:  Trenda Moots Daughter [phone]     Current Level of Care: Hospital Recommended Level of Care: Family Care Home ([facility]) Prior Approval Number:    Date Approved/Denied:   PASRR Number: [id number]  Discharge Plan: Other (Comment) ([facility])    Current Diagnoses: Patient Active Problem List   Diagnosis Date Noted  . Sepsis (HCC) 12/14/2016  . Recurrent UTI 02/17/2016  . A-fib (HCC) 02/16/2016  . Clinical depression 02/16/2016  . Dementia 02/16/2016  . Hypercholesteremia 02/16/2016  . Temporary cerebral vascular dysfunction 02/16/2016  . Excessive falling 02/16/2016  . Gait instability 02/16/2016  . Carotid stenosis   . Cholelithiasis without obstruction 05/14/2007  . Adult hypothyroidism 09/08/2006  . Allergic rhinitis 02/12/2006  . Colon, diverticulosis 02/12/2006  . Generalized OA 02/12/2006  . Glaucoma, compensated 02/12/2006  . BP (high blood pressure) 02/12/2006    Orientation RESPIRATION BLADDER Height & Weight     Self  O2, Other (Comment) (Venturi Mask oxygen 8L) Incontinent Weight: 132 lb 8 oz (60.1 kg) Height:  5\' 1"  (154.9 cm)  BEHAVIORAL SYMPTOMS/MOOD NEUROLOGICAL BOWEL NUTRITION STATUS      Incontinent  (Dysphagia 2 diet)  AMBULATORY STATUS COMMUNICATION OF NEEDS Skin   Extensive Assist Verbally Normal                       Personal Care Assistance Level of Assistance  Bathing, Feeding, Dressing Bathing Assistance: Limited  assistance Feeding assistance: Limited assistance Dressing Assistance: Limited assistance     Functional Limitations Info  Sight, Hearing, Speech Sight Info: Adequate Hearing Info: Adequate Speech Info: Adequate    SPECIAL CARE FACTORS FREQUENCY  PT (By licensed PT)     PT Frequency: Home Health PT minimum 2x a week              Contractures Contractures Info: Present    Additional Factors Info  Code Status, Allergies, Psychotropic Code Status Info: DNR Allergies Info: Penicillins Psychotropic Info: escitalopram (LEXAPRO) tablet 20 mg         Current Medications (12/20/2016):  This is the current hospital active medication list Current Facility-Administered Medications  Medication Dose Route Frequency Provider Last Rate Last Dose  . 0.9 %  sodium chloride infusion   Intravenous Continuous Marguarite Arbour, MD 20 mL/hr at 12/18/16 0319    . acetaminophen (TYLENOL) tablet 650 mg  650 mg Oral Q6H PRN Adrian Saran, MD   650 mg at 12/15/16 8469   Or  . acetaminophen (TYLENOL) suppository 650 mg  650 mg Rectal Q6H PRN Adrian Saran, MD      . acetaminophen (TYLENOL) tablet 1,000 mg  1,000 mg Oral QHS Adrian Saran, MD   1,000 mg at 12/17/16 2051  . aspirin tablet 325 mg  325 mg Oral Daily Adrian Saran, MD   325 mg at 12/19/16 1257  . azelastine (ASTELIN) 0.1 % nasal spray 1 spray  1 spray Each Nare BID Valentina Gu, RPH   1 spray at 12/19/16 2224  And  . fluticasone (FLONASE) 50 MCG/ACT nasal spray 1 spray  1 spray Each Nare BID Valentina GuScott D Christy, RPH   1 spray at 12/19/16 2223  . benzonatate (TESSALON) capsule 100 mg  100 mg Oral TID PRN Enedina FinnerSona Patel, MD   100 mg at 12/16/16 0332  . bisacodyl (DULCOLAX) suppository 10 mg  10 mg Rectal Daily PRN Adrian SaranSital Mody, MD      . cefUROXime (CEFTIN) tablet 500 mg  500 mg Oral BID WC Enedina FinnerSona Patel, MD   500 mg at 12/19/16 1722  . divalproex (DEPAKOTE SPRINKLE) capsule 250 mg  250 mg Oral BID Enedina FinnerSona Patel, MD      . enoxaparin (LOVENOX) injection 40 mg   40 mg Subcutaneous Q24H Enedina FinnerSona Patel, MD   40 mg at 12/19/16 2224  . escitalopram (LEXAPRO) tablet 20 mg  20 mg Oral Daily Adrian SaranSital Mody, MD   20 mg at 12/19/16 1258  . feeding supplement (ENSURE ENLIVE) (ENSURE ENLIVE) liquid 237 mL  237 mL Oral BID BM Enedina FinnerSona Patel, MD   237 mL at 12/19/16 1722  . guaiFENesin (ROBITUSSIN) 100 MG/5ML solution 100 mg  5 mL Oral Q4H while awake Enedina FinnerSona Patel, MD      . HYDROcodone-acetaminophen (NORCO/VICODIN) 5-325 MG per tablet 1-2 tablet  1-2 tablet Oral Q4H PRN Sital Mody, MD      . ipratropium-albuterol (DUONEB) 0.5-2.5 (3) MG/3ML nebulizer solution 3 mL  3 mL Nebulization TID Marguarite ArbourJeffrey D Sparks, MD   3 mL at 12/20/16 0730  . levothyroxine (SYNTHROID, LEVOTHROID) tablet 100 mcg  100 mcg Oral QAC breakfast Adrian SaranSital Mody, MD   100 mcg at 12/19/16 1256  . liver oil-zinc oxide (DESITIN) 40 % ointment   Topical BID PRN Valentina GuScott D Christy, RPH      . LORazepam (ATIVAN) tablet 0.5 mg  0.5 mg Oral Q8H PRN Adrian SaranSital Mody, MD      . MEDLINE mouth rinse  15 mL Mouth Rinse BID Enedina FinnerSona Patel, MD   15 mL at 12/19/16 2224  . memantine (NAMENDA) tablet 10 mg  10 mg Oral BID Adrian SaranSital Mody, MD   10 mg at 12/19/16 1256  . metoprolol (LOPRESSOR) injection 5 mg  5 mg Intravenous Once Enedina FinnerSona Patel, MD   Stopped at 12/18/16 1930  . metoprolol succinate (TOPROL-XL) 24 hr tablet 25 mg  25 mg Oral Daily Enedina FinnerSona Patel, MD   25 mg at 12/19/16 1257  . potassium chloride (K-DUR,KLOR-CON) CR tablet 10 mEq  10 mEq Oral Daily Enedina FinnerSona Patel, MD   10 mEq at 12/19/16 1258  . pravastatin (PRAVACHOL) tablet 20 mg  20 mg Oral QHS Adrian SaranSital Mody, MD   20 mg at 12/17/16 2052  . senna-docusate (Senokot-S) tablet 1 tablet  1 tablet Oral QHS PRN Adrian SaranSital Mody, MD      . sodium chloride flush (NS) 0.9 % injection 3 mL  3 mL Intravenous Q12H Adrian SaranSital Mody, MD   3 mL at 12/19/16 2224  . timolol (TIMOPTIC) 0.5 % ophthalmic solution 1 drop  1 drop Both Eyes BID Adrian SaranSital Mody, MD   1 drop at 12/19/16 2224     Discharge Medications: Please see discharge  summary for a list of discharge medications.  Relevant Imaging Results:  Relevant Lab Results:   Additional Information SS# [id number] patient has Alzheimer's Dementia  Deronda Christian, Ervin KnackEric R, LCSWA

## 2016-12-20 NOTE — Consult Note (Signed)
Consultation Note Date: 12/20/2016   Patient Name: Paula Summers  DOB: 1931-05-10  MRN: [id number]  Age / Sex: 83 y.o., female  PCP: No Pcp Per Patient Referring Physician: Gladstone Lighter, MD  Reason for Consultation: Establishing goals of care and Hospice Evaluation  HPI/Patient Profile: 83 y.o. female  with past medical history of dementia, hypertension, hyperlipidemia, depression, CHF, carotid stenosis, and afib admitted on 12/14/2016 with altered mental status from [facility]. Seen by neurologist prior to admit where urine was found positive for UTI-on cipro. At group home, patient with increased lethargy and chills. In ED, chest xray revealed possible pneumonia, positive for UTI, and septic with hypotension. Acute hypoxic respiratory failure along with acute on chronic CHF with EF 45-50%. Patient has been on venti-mask and unable to wean without desaturation. Palliative medicine consultation for goals of care.   Clinical Assessment and Goals of Care: I have reviewed medical records, received report from Dr. Tressia Miners and RN, met at the bedside along with daughter Paula Summers) to discuss diagnosis, prognosis, GOC, EOL wishes, disposition and options.  I introduced Palliative Medicine as specialized medical care for people living with serious illness. It focuses on providing relief from the symptoms and stress of a serious illness. The goal is to improve quality of life for both the patient and the family.  We discussed a brief life review of the patient. She was married for 51 years to her husband who died three years ago. She has three children, two living daughters and one son who has died. [name] has 11 grandchildren. She was a very dedicated wife and hardworking woman. Her and her husband loved spending time at the beach or lake with camper. They enjoyed fishing. 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   Since the death of her husband, Paula Summers feels her mother's dementia has progressed quickly. She sits up all day at the group home but never interested in participating in activities. She often asks about her husband and talks to him like he is still alive.   Paula Summers and I discussed current medical status and co-morbidities that are contributing to her decline. Discussed the natural trajectory of dementia. Discussed the concern that despite aggressive medical management since admission, her mother remains ill and unable to wean from venti mask.  Paula Summers has a good understanding that her mother may be nearing the end of her life. She has been preparing the family for this also. The patient has had advanced directives/living will in place and spoke of her wishes of no resuscitation, life support, or feeding tube.   Discussed transition from aggressive medical pathway to a comfort pathway, including weaning the patient from venti mask to nasal cannula. Explained the focus being comfort, quality, dignity for as much time as she may have left. Discussed symptom management and expectations at EOL. Paula Summers agrees with this and just wants her mother to be comfortable stating "she has lived a good life" and "is  ready to go."   [name] has been alert and pleasantly confused during our conversation. Daughter tells me she ate her whole lunch. Explained that weekend MD will monitor how she is in the next 24 hours. Once weaned from venti, patient may decline-- possible hospital death vs. Residential hospice facility. If patient is awake, alert and eating once weaned from venti mask, she could go to group home with hospice. Daughter understands that anything could happen at any time and understands the focus will be on comfort and symptom management.   Questions answered. Hard Choices booklet left for review. Encouraged Paula Summers to call with questions or  concerns.   SUMMARY OF RECOMMENDATIONS    DNR/DNI  Transitioned to comfort measures only. No escalation of care. Discontinued medications/labs/interventions not aimed at comfort.   Spoke with RN-plan is to wean off of venti mask to nasal cannula. Pre-medicate with IV morphine prior to weaning.   Symptom management--see below  Disposition pending depending her clinical condition after weaning off of venti mask. I anticipate hospital death vs. Residential hospice facility.   If patient survives hospitalization, Daughter agrees with hospice whether at group home or residential hospice.   PMT not at [facility] over the weekend. This NP will be at [facility]. Please contact me on amion if any questions or concerns.   Code Status/Advance Care Planning:  DNR   Symptom Management:   Roxanol 2.11m PO q1h prn pain/dyspnea/air hunger  May give Morphine 1255mIV q1h prn pain/dyspnea/air hunger if unable to take PO  Ativan 0.55m32mO q4h prn anxiety  Continue scheduled depakote sprinkles for agitation if able to swallow  Palliative Prophylaxis:   Aspiration, Delirium Protocol, Frequent Pain Assessment, Oral Care and Turn Reposition  Additional Recommendations (Limitations, Scope, Preferences):  Full Comfort Care  Psycho-social/Spiritual:   Desire for further Chaplaincy support:yes  Additional Recommendations: Caregiving  Support/Resources and Education on Hospice  Prognosis:   Unable to determine  Discharge Planning: To Be Determined      Primary Diagnoses: Present on Admission: . [place] ([place] I have reviewed the medical record, interviewed the patient and family, and examined the patient. The following aspects are pertinent.  Past Medical History:  Diagnosis Date  . Alzheimer disease   . Atrial fibrillation ([place] . Breast screening, unspecified   . Carotid stenosis   . CH[place] (congestive heart failure) ([place] . Depression   . Glaucoma   . Hyperlipidemia   .  Hypertension   . Hypothyroidism   . Personal history of tobacco use, presenting hazards to health   . Special screening for malignant neoplasms, colon    Social History   Social History  . Marital status: Widowed    Spouse name: N/A  . Number of children: N/A  . Years of education: N/A   Social History Main Topics  . Smoking status: Former Smoker    Packs/day: 1.00    Years: 20.00    Types: Cigarettes  . Smokeless tobacco: Never Used  . Alcohol use No  . Drug use: No  . Sexual activity: Not Asked   Other Topics Concern  . None   Social History Narrative  . None   Family History  Problem Relation Age of Onset  . Bladder Cancer Neg Hx   . Prostate cancer Neg Hx   . Kidney cancer Neg Hx    Scheduled Meds: . acetaminophen  1,000 mg Oral QHS  . divalproex  250 mg Oral BID  .  mouth rinse  15 mL Mouth Rinse BID  . sodium chloride flush  3 mL Intravenous Q12H   Continuous Infusions: PRN Meds:.acetaminophen **OR** acetaminophen, benzonatate, bisacodyl, HYDROcodone-acetaminophen, ipratropium-albuterol, liver oil-zinc oxide, LORazepam, morphine injection, morphine CONCENTRATE, senna-docusate Medications Prior to Admission:  Prior to Admission medications   Medication Sig Start Date End Date Taking? Authorizing Provider  acetaminophen (TYLENOL) 500 MG tablet Take 1,000 mg by mouth at bedtime.    Yes Historical Provider, MD  aspirin 325 MG tablet Take 1 tablet (325 mg total) by mouth daily. 12/13/16  Yes Dennis E Chrismon, PA  Azelastine-Fluticasone 137-50 MCG/ACT SUSP Place 2 sprays into the nose 2 (two) times daily.    Yes Historical Provider, MD  ciprofloxacin (CIPRO) 500 MG tablet Take 1 tablet (500 mg total) by mouth 2 (two) times daily. 12/11/16  Yes Birdie Sons, MD  Cranberry 450 MG CAPS Take 1 capsule by mouth 2 (two) times daily.   Yes Historical Provider, MD  divalproex (DEPAKOTE) 250 MG DR tablet Take 250 mg by mouth 2 (two) times daily.   Yes Historical Provider, MD    escitalopram (LEXAPRO) 20 MG tablet Take 20 mg by mouth daily.   Yes Historical Provider, MD  furosemide (LASIX) 20 MG tablet Take 10 mg by mouth daily.   Yes Historical Provider, MD  guaiFENesin (MUCINEX) 600 MG 12 hr tablet Take 600 mg by mouth 2 (two) times daily as needed.   Yes Historical Provider, MD  levothyroxine (SYNTHROID, LEVOTHROID) 100 MCG tablet Take 100 mcg by mouth daily.  02/19/16  Yes Historical Provider, MD  LORazepam (ATIVAN) 0.5 MG tablet Take 1 tablet (0.5 mg total) by mouth every 8 (eight) hours as needed for anxiety. 09/01/16 09/01/17 Yes Lisa Roca, MD  memantine (NAMENDA) 10 MG tablet Take 10 mg by mouth 2 (two) times daily.   Yes Historical Provider, MD  metoprolol succinate (TOPROL-XL) 25 MG 24 hr tablet Take 1 tablet (25 mg total) by mouth daily. 12/13/16  Yes Dennis E Chrismon, PA  potassium chloride (K-DUR) 10 MEQ tablet Take 1 tablet (10 mEq total) by mouth daily. 12/13/16  Yes Dennis E Chrismon, PA  pravastatin (PRAVACHOL) 20 MG tablet Take 1 tablet (20 mg total) by mouth daily. 12/13/16  Yes [place], PA  Skin Protectants, Misc. (DIMETHICONE-ZINC OXIDE) cream Apply topically 2 (two) times daily as needed (to peri area). To peri area   Yes Historical Provider, MD  timolol (TIMOPTIC) 0.5 % ophthalmic solution Place 1 drop into both eyes 2 (two) times daily.    Yes Historical Provider, MD  azithromycin (ZITHROMAX) 250 MG tablet Take daily as directed 12/16/16   Fritzi Mandes, MD  benzonatate (TESSALON) 100 MG capsule Take 1 capsule (100 mg total) by mouth 3 (three) times daily as needed for cough. 12/16/16   Fritzi Mandes, MD  cefUROXime (CEFTIN) 500 MG tablet Take 1 tablet (500 mg total) by mouth 2 (two) times daily with a meal. 12/16/16   Fritzi Mandes, MD   Allergies  Allergen Reactions  . Penicillins Hives   Review of Systems  Unable to perform ROS: Dementia    Physical Exam  Constitutional: She appears ill.  HENT:  Head: Normocephalic and atraumatic.   Cardiovascular: Normal heart sounds.  An irregularly irregular rhythm present.  Pulmonary/Chest: Effort normal. She has decreased breath sounds. She has rhonchi.  Abdominal: Soft. Bowel sounds are normal. There is no tenderness.  Neurological: She is alert. She is disoriented.  Pleasantly confused  Skin: Skin  is warm and dry. Capillary refill takes more than 3 seconds. There is cyanosis.  Psychiatric: Her speech is delayed. Cognition and memory are impaired. She is inattentive.  Nursing note and vitals reviewed.  Vital Signs: BP 140/81 (BP Location: Left Arm)   Pulse 89   Temp 97.8 F (36.6 C) (Oral)   Resp 16   Ht 5' 1"  (1.549 m)   Wt 60.1 kg (132 lb 8 oz)   SpO2 97%   BMI 25.04 kg/m  Pain Assessment: PAINAD POSS *See Group Information*: 1-Acceptable,Awake and alert Pain Score: Asleep  SpO2: SpO2: 97 % O2 Device:SpO2: 97 % O2 Flow Rate: .O2 Flow Rate (L/min): 8 L/min  IO: Intake/output summary:   Intake/Output Summary (Last 24 hours) at 12/20/16 1633 Last data filed at 12/20/16 0900  Gross per 24 hour  Intake                0 ml  Output                0 ml  Net                0 ml    LBM: Last BM Date: 12/19/16 Baseline Weight: Weight: 63.5 kg (140 lb) Most recent weight: Weight: 60.1 kg (132 lb 8 oz)     Palliative Assessment/Data: PPS 30%   Flowsheet Rows   Flowsheet Row Most Recent Value  Intake Tab  Referral Department  Hospitalist  Unit at Time of Referral  Oncology Unit  Palliative Care Primary Diagnosis  Cardiac  Date Notified  12/20/16  Palliative Care Type  New Palliative care  Reason for referral  Clarify Goals of Care, Counsel Regarding Hospice  Date of Admission  12/14/16  # of days IP prior to Palliative referral  6  Clinical Assessment  Palliative Performance Scale Score  30%  Psychosocial & Spiritual Assessment  Palliative Care Outcomes  Patient/Family meeting held?  Yes  Who was at the meeting?  patient and daughter Paula Summers)  Palliative  Care Outcomes  Clarified goals of care, Provided end of life care assistance, Provided psychosocial or spiritual support, Changed to focus on comfort, Counseled regarding hospice, Improved pain interventions, Improved non-pain symptom therapy      Time In: 1420 Time Out: 1540 Time Total: 74mn Greater than 50%  of this time was spent counseling and coordinating care related to the above assessment and plan.  Signed by:  MIhor Dow FNP-C Palliative Medicine Team  Phone: [phone]: [phone]  Please contact Palliative Medicine Team phone at [phone] questions and concerns.  For individual provider: See AShea Evans

## 2016-12-20 NOTE — Plan of Care (Signed)
Problem: Tissue Perfusion: Goal: Risk factors for ineffective tissue perfusion will decrease Outcome: Not Progressing Pt noted to cyanotic finger tips  Problem: Nutrition: Goal: Adequate nutrition will be maintained Outcome: Progressing Needs to assist with eating

## 2016-12-21 NOTE — Progress Notes (Signed)
[facility] -  at [facility]   PATIENT NAME: Paula MansonJeanette Summers    MR#:  [id number]  DATE OF BIRTH:  July 16, 1931  SUBJECTIVE:  CHIEF COMPLAINT:   Chief Complaint  Patient presents with  . Altered Mental Status   - Appears confused but pleasant, denies any complaints. -Currently on comfort measures. Monitor over the weekend.  REVIEW OF SYSTEMS:  Review of Systems  Unable to perform ROS: Dementia    DRUG ALLERGIES:   Allergies  Allergen Reactions  . Penicillins Hives    VITALS:  Blood pressure (!) 142/81, pulse 92, temperature 97.8 F (36.6 C), temperature source Oral, resp. rate 20, height 5\' 1"  (1.549 m), weight 60.1 kg (132 lb 8 oz), SpO2 96 %.  PHYSICAL EXAMINATION:  Physical Exam  GENERAL:  83 y.o.-year-old patient lying in the bed with no acute distress.  EYES: Pupils equal, round, reactive to light and accommodation. No scleral icterus. Extraocular muscles intact.  HEENT: Head atraumatic, normocephalic. Oropharynx and nasopharynx clear.  NECK:  Supple, no jugular venous distention. No thyroid enlargement, no tenderness.  LUNGS: Normal breath sounds bilaterally, no wheezing, rales,rhonchi or crepitation. No use of accessory muscles of respiration. Decreased bibasilar breath sounds CARDIOVASCULAR: S1, S2 normal. No murmurs, rubs, or gallops.  ABDOMEN: Soft, nontender, nondistended. Bowel sounds present. No organomegaly or mass.  EXTREMITIES: No pedal edema, cyanosis, or clubbing.  NEUROLOGIC: Follows simple commands. No focal motor or sensory deficits. Global weakness noted. PSYCHIATRIC: The patient is alert and oriented to self.  SKIN: No obvious rash, lesion, or ulcer.    LABORATORY PANEL:   CBC  Recent Labs Lab 12/15/16 0456  WBC 7.6  HGB 10.1*  HCT 30.6*  PLT 192   ------------------------------------------------------------------------------------------------------------------  Chemistries   Recent Labs Lab  12/15/16 0456 12/18/16 0425  NA 140  --   K 3.8  --   CL 108  --   CO2 21*  --   GLUCOSE 89  --   BUN 51*  --   CREATININE 1.61* 0.70  CALCIUM 7.8*  --    ------------------------------------------------------------------------------------------------------------------  Cardiac Enzymes No results for input(s): TROPONINI in the last 168 hours. ------------------------------------------------------------------------------------------------------------------  RADIOLOGY:  No results found.  EKG:   Orders placed or performed during the hospital encounter of 12/18/15  . ED EKG  . ED EKG  . EKG 12-Lead  . EKG 12-Lead    ASSESSMENT AND PLAN:   83 year old female with severe Alzheimer's dementia who presents with acute encephalopathy and acute hypoxic respiratory failure with sepsis.  1. Acute hypoxia respiratory failure due to Acute bronchtis - Was on antibiotics, however currently on comfort care. -Right advanced as requested by family to a soft diet. Blood cultures so far negative   high risk of aspiration. Currently on 2 L nasal cannula. - Overall poor prognosis. Continue to monitor over weekend. If no improvement, hospice home. If improvement-plan is to discharge to group home with hospice services. -On morphine and other comfort medications. -Appreciate palliative care consult  2. Acute on chronic CHF, diastolic with COPD flare -(h/o smoking in the past) -ECHO shows EF 55-60% - on nasal cannula now  3. Acute encephalopathy in the setting of problem #1 and 2 and severe Alzheimer's dementia: Fluctuating mental status- sleeps all day and sometimes more alert Avoid benzodiazepines if possible depakote for mood stabilization  4. Acute kidney injury: no nephrotoxic agents resolved  5. History of atrial fibrillation  6. DVT prophylaxis- will be discontinued as transitioned to comfort  care  If improves- group home with hospice, if worsens- hospice home Monitor  over the weekend.   All the records are reviewed and case discussed with Care Management/Social Workerr. Management plans discussed with the patient, family and they are in agreement.  CODE STATUS: DO NOT RESUSCITATE  TOTAL TIME TAKING CARE OF THIS PATIENT: 32 minutes.   POSSIBLE D/C IN 2 days, DEPENDING ON CLINICAL CONDITION.   Enid Baas M.D on 12/21/2016 at 1:42 PM  Between 7am to 6pm - Pager - [phone]  After 6pm go to [email or web address] - Social research officer, government  [facility]  Office  [phone]  CC: Primary care physician; No PCP Per Patient

## 2016-12-21 NOTE — Progress Notes (Signed)
Pt inadvertently pulled IV out. I called and spoke to Dr. Nemiah CommanderKalisetti about reinserting IV as pt is comfort care measures at this time. Per MD, it is okay to leave IV out and medicate pt with PO morphine as needed for pain/air hunger.

## 2016-12-21 NOTE — Progress Notes (Signed)
Pt's daughter visiting pt at bedside and requesting that the pt's diet be advanced to soft diet. She is concerned that the pt is not eating much due to the unappetizing nature of pureed foods. Pt's daughter is aware that the recommendation from therapist was a pureed diet to avoid aspiration. Pt's daughter is still adamant about changing pt's diet so that the pt can enjoy her meal more. Dr. Nemiah CommanderKalisetti updated, orders updated in EPIC.

## 2016-12-22 NOTE — Clinical Social Work Note (Signed)
CSW spoke with [name] from [facility] about possible hospice bed. [name] indicated that the Hospice/Palliative Liaison will follow-up tomorrow due to the patient's fluctuating needs. CSW will con't to follow.  Paula PonderKaren Martha [name], MSW, Theresia MajorsLCSWA [phone]

## 2016-12-22 NOTE — Clinical Social Work Note (Signed)
CSW received verbal consult from MD for referral to hospice facility. CSW contacted Eunice Blaseebbie, RN admissions coordinator for [facility]. Referral packet sent. Eunice BlaseDebbie will advise once assessed. CSW following.  Argentina PonderKaren Martha Ariel Wingrove, MSW, Theresia MajorsLCSWA [phone]

## 2016-12-22 NOTE — Progress Notes (Signed)
[facility] - [place] at [facility]   PATIENT NAME: Paula MansonJeanette Vacha    MR#:  [id number]  DATE OF BIRTH:  28-Jul-1931  SUBJECTIVE:  CHIEF COMPLAINT:   Chief Complaint  Patient presents with  . Altered Mental Status   - more alert, family at bedside - on comfort measures  REVIEW OF SYSTEMS:  Review of Systems  Unable to perform ROS: Dementia    DRUG ALLERGIES:   Allergies  Allergen Reactions  . Penicillins Hives    VITALS:  Blood pressure 121/72, pulse 89, temperature 97.5 F (36.4 C), temperature source Axillary, resp. rate 16, height 5\' 1"  (1.549 m), weight 60.1 kg (132 lb 8 oz), SpO2 99 %.  PHYSICAL EXAMINATION:  Physical Exam  GENERAL:  83 y.o.-year-old patient lying in the bed with no acute distress.  EYES: Pupils equal, round, reactive to light and accommodation. No scleral icterus. Extraocular muscles intact.  HEENT: Head atraumatic, normocephalic. Oropharynx and nasopharynx clear.  NECK:  Supple, no jugular venous distention. No thyroid enlargement, no tenderness.  LUNGS: Normal breath sounds bilaterally, no wheezing, rales,rhonchi or crepitation. No use of accessory muscles of respiration. Decreased bibasilar breath sounds CARDIOVASCULAR: S1, S2 normal. No murmurs, rubs, or gallops.  ABDOMEN: Soft, nontender, nondistended. Bowel sounds present. No organomegaly or mass.  EXTREMITIES: No pedal edema, cyanosis, or clubbing.  NEUROLOGIC: Follows simple commands. No focal motor or sensory deficits. Global weakness noted. PSYCHIATRIC: The patient is alert and oriented to self.  SKIN: No obvious rash, lesion, or ulcer.    LABORATORY PANEL:   CBC No results for input(s): WBC, HGB, HCT, PLT in the last 168 hours. ------------------------------------------------------------------------------------------------------------------  Chemistries   Recent Labs Lab 12/18/16 0425  CREATININE 0.70    ------------------------------------------------------------------------------------------------------------------  Cardiac Enzymes No results for input(s): TROPONINI in the last 168 hours. ------------------------------------------------------------------------------------------------------------------  RADIOLOGY:  No results found.  EKG:   Orders placed or performed during the hospital encounter of 12/18/15  . ED EKG  . ED EKG  . EKG 12-Lead  . EKG 12-Lead    ASSESSMENT AND PLAN:   83 year old female with severe Alzheimer's dementia who presents with acute encephalopathy and acute hypoxic respiratory failure with sepsis.  1. Acute hypoxia respiratory failure due to Acute bronchtis - Was on antibiotics, however currently on comfort care. -diet advanced as requested by family to a soft diet. Blood cultures so far negative   high risk of aspiration. Currently on 2 L nasal cannula. - Overall poor prognosis. Continue to monitor over weekend. If no improvement, hospice home. If improvement-plan is to discharge to group home with hospice services. -On morphine and other comfort medications. -Appreciate palliative care consult  2. Acute on chronic CHF, diastolic with COPD flare -(h/o smoking in the past) -ECHO shows EF 55-60% - on nasal cannula now  3. Acute encephalopathy in the setting of problem #1 and 2 and severe Alzheimer's dementia: Fluctuating mental status- sleeps all day and sometimes more alert Avoid benzodiazepines if possible depakote for mood stabilization  4. Acute kidney injury: no nephrotoxic agents resolved  5. History of atrial fibrillation  6. DVT prophylaxis- will be discontinued as transitioned to comfort care  If improves- group home with hospice, if worsens- hospice home Monitor over the weekend. Daughter updated at bedside   All the records are reviewed and case discussed with Care Management/Social Workerr. Management plans discussed  with the patient, family and they are in agreement.  CODE STATUS: DO NOT RESUSCITATE  TOTAL TIME TAKING CARE OF  THIS PATIENT: 26 minutes.   POSSIBLE D/C TOMORROW, DEPENDING ON CLINICAL CONDITION.   Enid Baas M.D on 12/22/2016 at 12:14 PM  Between 7am to 6pm - Pager - [phone]  After 6pm go to [email or web address] - Social research officer, government  [facility]  Office  [phone]  CC: Primary care physician; No PCP Per Patient

## 2016-12-23 DIAGNOSIS — R1310 Dysphagia, unspecified: Secondary | ICD-10-CM

## 2016-12-23 MED ORDER — IPRATROPIUM-ALBUTEROL 0.5-2.5 (3) MG/3ML IN SOLN: 3.0000 mL | RESPIRATORY_TRACT | 0 refills | Status: AC | PRN

## 2016-12-23 NOTE — NC FL2 (Signed)
[place] MEDICAID FL2 LEVEL OF CARE SCREENING TOOL     IDENTIFICATION  Patient Name: Paula BertinJeanette W Defrank Birthdate: 07-17-1931 Sex: female Admission Date (Current Location): 12/14/2016  [place]   Facility and Address:  [facility], [address], [place], [place] [address]      Provider Number: [id number]  Attending Physician Name and Address:  Enid Baasadhika Kalisetti, MD  Relative Name and Phone Number:  Trenda MootsMurphy,Janice A Daughter [phone]     Current Level of Care: Hospital Recommended Level of Care: SNF  Prior Approval Number:    Date Approved/Denied:   PASRR Number: [id number]  Discharge Plan: SNF     Current Diagnoses: Patient Active Problem List   Diagnosis Date Noted  . Alzheimer's dementia with behavioral disturbance   . Acute on chronic respiratory failure with hypoxia (HCC)   . Acute on chronic congestive heart failure (HCC)   . Palliative care by specialist   . Comfort measures only status   . Dyspnea   . Sepsis (HCC) 12/14/2016  . Recurrent UTI 02/17/2016  . A-fib (HCC) 02/16/2016  . Clinical depression 02/16/2016  . Dementia 02/16/2016  . Hypercholesteremia 02/16/2016  . Temporary cerebral vascular dysfunction 02/16/2016  . Excessive falling 02/16/2016  . Gait instability 02/16/2016  . Carotid stenosis   . Cholelithiasis without obstruction 05/14/2007  . Adult hypothyroidism 09/08/2006  . Allergic rhinitis 02/12/2006  . Colon, diverticulosis 02/12/2006  . Generalized OA 02/12/2006  . Glaucoma, compensated 02/12/2006  . BP (high blood pressure) 02/12/2006    Orientation RESPIRATION BLADDER Height & Weight     Self  3 Liters Oxygen  Incontinent Weight: 132 lb 8 oz (60.1 kg) Height:  5\' 1"  (154.9 cm)  BEHAVIORAL SYMPTOMS/MOOD NEUROLOGICAL BOWEL NUTRITION STATUS      Incontinent  (Dysphagia 2 diet)  AMBULATORY STATUS COMMUNICATION OF NEEDS Skin   Extensive Assist Verbally Normal                        Personal Care Assistance Level of Assistance  Bathing, Feeding, Dressing Bathing Assistance: Limited assistance Feeding assistance: Limited assistance Dressing Assistance: Limited assistance     Functional Limitations Info  Sight, Hearing, Speech Sight Info: Adequate Hearing Info: Adequate Speech Info: Adequate    SPECIAL CARE FACTORS FREQUENCY  PT (By licensed PT) and OT      PT and PT 4-5 times per week.               Contractures Contractures Info: Present    Additional Factors Info  Code Status, Allergies, Psychotropic Code Status Info: DNR Allergies Info: Penicillins Psychotropic Info: escitalopram (LEXAPRO) tablet 20 mg         Current Medications (12/23/2016):  This is the current hospital active medication list Current Facility-Administered Medications  Medication Dose Route Frequency Provider Last Rate Last Dose  . acetaminophen (TYLENOL) tablet 650 mg  650 mg Oral Q6H PRN Adrian SaranSital Mody, MD   650 mg at 12/22/16 1555   Or  . acetaminophen (TYLENOL) suppository 650 mg  650 mg Rectal Q6H PRN Adrian SaranSital Mody, MD      . acetaminophen (TYLENOL) tablet 1,000 mg  1,000 mg Oral QHS Adrian SaranSital Mody, MD   1,000 mg at 12/22/16 2123  . benzonatate (TESSALON) capsule 100 mg  100 mg Oral TID PRN Enedina FinnerSona Patel, MD   100 mg at 12/16/16 0332  . bisacodyl (DULCOLAX) suppository 10 mg  10 mg Rectal Daily PRN Sital Mody,  MD      . divalproex (DEPAKOTE SPRINKLE) capsule 250 mg  250 mg Oral BID Enedina Finner, MD   250 mg at 12/23/16 1001  . HYDROcodone-acetaminophen (NORCO/VICODIN) 5-325 MG per tablet 1-2 tablet  1-2 tablet Oral Q4H PRN Adrian Saran, MD   1 tablet at 12/22/16 1653  . ipratropium-albuterol (DUONEB) 0.5-2.5 (3) MG/3ML nebulizer solution 3 mL  3 mL Nebulization TID PRN Alita Chyle, NP      . liver oil-zinc oxide (DESITIN) 40 % ointment   Topical BID PRN Valentina Gu, RPH      . LORazepam (ATIVAN) tablet 0.5 mg  0.5 mg Oral Q4H PRN Alita Chyle, NP      . MEDLINE  mouth rinse  15 mL Mouth Rinse BID Enedina Finner, MD   15 mL at 12/23/16 1011  . morphine 2 MG/ML injection 1 mg  1 mg Intravenous Q1H PRN Alita Chyle, NP   1 mg at 12/20/16 1614  . morphine CONCENTRATE 10 MG/0.5ML oral solution 2.6 mg  2.6 mg Oral Q1H PRN Alita Chyle, NP      . senna-docusate (Senokot-S) tablet 1 tablet  1 tablet Oral QHS PRN Adrian Saran, MD      . sodium chloride flush (NS) 0.9 % injection 3 mL  3 mL Intravenous Q12H Adrian Saran, MD   3 mL at 12/21/16 1014     Discharge Medications: Please see discharge summary for a list of discharge medications.  Relevant Imaging Results:  Relevant Lab Results:   Additional Information SS# [id number] patient has Alzheimer's Dementia  Shaynah Hund, Darleen Crocker, LCSW

## 2016-12-23 NOTE — Care Management Important Message (Signed)
Important Message  Patient Details  Name: Babs BertinJeanette W Thwaites MRN: [id number] Date of Birth: 05/16/1931   Medicare Important Message Given:  Yes    Gwenette GreetBrenda S Jameel Quant, RN 12/23/2016, 9:57 AM

## 2016-12-23 NOTE — Progress Notes (Signed)
[facility]-no distress noted.

## 2016-12-23 NOTE — Progress Notes (Signed)
Per PT today patient is appropriate for short term rehab. Clinical Child psychotherapistocial Worker (CSW) Interior and spatial designerdirector approved 5 day LOG. [facility] [facility] agree[facility] to accept patient today on a 5 day LOG and start [place] authorization today. [facility] can come today to room 103-A. RN will call report and arrange EMS for transport. Clinical Child psychotherapistocial Worker (CSW) sent D/C orders to [facility] via [place] [facility]. Patient's daughter Liborio NixonJanice is aware of above. Please reconsult if future social work needs arise. CSW signing off.   B[employer] Hughes IncorporatedBailey Catherene Kaleta, LCSW [phone]

## 2016-12-23 NOTE — Progress Notes (Signed)
Paula HurterJanice Murphy (daughter/POA) updated we are still waiting on EMS to pick up patient [facility].

## 2016-12-23 NOTE — Discharge Summary (Signed)
[facility] - [place] at [facility]   PATIENT NAME: Paula MansonJeanette Schweigert    MR#:  [id number]  DATE OF BIRTH:  Jul 12, 1931  DATE OF ADMISSION:  12/14/2016   ADMITTING PHYSICIAN: Adrian SaranSital Mody, MD  DATE OF DISCHARGE: 12/23/2016  PRIMARY CARE PHYSICIAN: No PCP Per Patient   ADMISSION DIAGNOSIS:   Pneumonia [J18.9] Sepsis, due to unspecified organism (HCC) [A41.9] Altered mental status, unspecified altered mental status type [R41.82]  DISCHARGE DIAGNOSIS:   Active Problems:   Sepsis (HCC)   Alzheimer's dementia with behavioral disturbance   Acute on chronic respiratory failure with hypoxia (HCC)   Acute on chronic congestive heart failure (HCC)   Palliative care by specialist   Comfort measures only status   Dyspnea   SECONDARY DIAGNOSIS:   Past Medical History:  Diagnosis Date  . Alzheimer disease   . Atrial fibrillation (HCC)   . Breast screening, unspecified   . Carotid stenosis   . CHF (congestive heart failure) (HCC)   . Depression   . Glaucoma   . Hyperlipidemia   . Hypertension   . Hypothyroidism   . Personal history of tobacco use, presenting hazards to health   . Special screening for malignant neoplasms, colon     HOSPITAL COURSE:   83 year old female with severe Alzheimer's dementia who presents with acute encephalopathy and acute hypoxic respiratory failure with sepsis.  1. Acute hypoxia respiratory failure due to Acute bronchtis - received IV ABX and also ceftin here.  -diet advanced as requested by family to a soft diet. Blood cultures so far negative  high risk of aspiration. Currently on 2 L nasal cannula. - Overall poor prognosis. Continue to monitor over weekend. If no improvement, hospice home. If improvement-plan is to discharge to group home with hospice services. -On morphine and other comfort medications. -Appreciate palliative care consult  2. Acute on chronic CHF, diastolic with COPD flare -(h/o smoking in the  past) -ECHO shows EF 55-60% - on nasal cannula now. Low dose lasix  3. Acute encephalopathy in the setting of problem #1 and 2 and severe Alzheimer's dementia: Fluctuating mental status- sleeps all day and sometimes more alert depakote for mood stabilization lexapro for depression  4. Acute kidney injury: no nephrotoxic agents Resolved Low dose lasix daily  5. History of atrial fibrillation- on toprol and aspirin   [facility]    DISCHARGE CONDITIONS:   Guarded  CONSULTS OBTAINED:   Palliative Care consult  DRUG ALLERGIES:   Allergies  Allergen Reactions  . Penicillins Hives   DISCHARGE MEDICATIONS:   Allergies as of 12/23/2016      Reactions   Penicillins Hives      Medication List    STOP taking these medications   ciprofloxacin 500 MG tablet Commonly known as:  CIPRO     TAKE these medications   acetaminophen 500 MG tablet Commonly known as:  TYLENOL Take 1,000 mg by mouth at bedtime.   aspirin 325 MG tablet Take 1 tablet (325 mg total) by mouth daily.   Azelastine-Fluticasone 137-50 MCG/ACT Susp Place 2 sprays into the nose 2 (two) times daily.   benzonatate 100 MG capsule Commonly known as:  TESSALON Take 1 capsule (100 mg total) by mouth 3 (three) times daily as needed for cough.   Cranberry 450 MG Caps Take 1 capsule by mouth 2 (two) times daily.   dimethicone-zinc oxide cream Apply topically 2 (two) times daily as needed (to peri area). To  peri area   divalproex 250 MG DR tablet Commonly known as:  DEPAKOTE Take 250 mg by mouth 2 (two) times daily.   escitalopram 20 MG tablet Commonly known as:  LEXAPRO Take 20 mg by mouth daily.   furosemide 20 MG tablet Commonly known as:  LASIX Take 10 mg by mouth daily.   guaiFENesin 600 MG 12 hr tablet Commonly known as:  MUCINEX Take 600 mg by mouth 2 (two) times daily as needed.   ipratropium-albuterol 0.5-2.5 (3) MG/3ML Soln Commonly  known as:  DUONEB Take 3 mLs by nebulization every 4 (four) hours as needed.   levothyroxine 100 MCG tablet Commonly known as:  SYNTHROID, LEVOTHROID Take 100 mcg by mouth daily.   LORazepam 0.5 MG tablet Commonly known as:  ATIVAN Take 1 tablet (0.5 mg total) by mouth every 8 (eight) hours as needed for anxiety.   memantine 10 MG tablet Commonly known as:  NAMENDA Take 10 mg by mouth 2 (two) times daily.   metoprolol succinate 25 MG 24 hr tablet Commonly known as:  TOPROL-XL Take 1 tablet (25 mg total) by mouth daily.   potassium chloride 10 MEQ tablet Commonly known as:  K-DUR Take 1 tablet (10 mEq total) by mouth daily.   pravastatin 20 MG tablet Commonly known as:  PRAVACHOL Take 1 tablet (20 mg total) by mouth daily.   timolol 0.5 % ophthalmic solution Commonly known as:  TIMOPTIC Place 1 drop into both eyes 2 (two) times daily.        DISCHARGE INSTRUCTIONS:   1. Palliative care follow up 2. PCP f/u in 1 week  DIET:   Regular diet- mechanical soft and thin liquids  ACTIVITY:   Activity as tolerated  OXYGEN:   Home Oxygen: Yes.    Oxygen Delivery: 2 liters/min via Patient connected to nasal cannula oxygen  DISCHARGE LOCATION:   Nursing home   If you experience worsening of your admission symptoms, develop shortness of breath, life threatening emergency, suicidal or homicidal thoughts you must seek medical attention immediately by calling 911 or calling your MD immediately  if symptoms less severe.  You Must read complete instructions/literature along with all the possible adverse reactions/side effects for all the Medicines you take and that have been prescribed to you. Take any new Medicines after you have completely understood and accpet all the possible adverse reactions/side effects.   Please note  You were cared for by a hospitalist during your hospital stay. If you have any questions about your discharge medications or the care you received  while you were in the hospital after you are discharged, you can call the unit and asked to speak with the hospitalist on call if the hospitalist that took care of you is not available. Once you are discharged, your primary care physician will handle any further medical issues. Please note that NO REFILLS for any discharge medications will be authorized once you are discharged, as it is imperative that you return to your primary care physician (or establish a relationship with a primary care physician if you do not have one) for your aftercare needs so that they can reassess your need for medications and monitor your lab values.    On the day of Discharge:  VITAL SIGNS:   Blood pressure 125/74, pulse 86, temperature 98 F (36.7 C), temperature source Axillary, resp. rate 16, height 5\' 1"  (1.549 m), weight 60.1 kg (132 lb 8 oz), SpO2 100 %.  PHYSICAL EXAMINATION:    GENERAL:  83 y.o.-year-old patient lying in the bed with no acute distress.  EYES: Pupils equal, round, reactive to light and accommodation. No scleral icterus. Extraocular muscles intact.  HEENT: Head atraumatic, normocephalic. Oropharynx and nasopharynx clear.  NECK:  Supple, no jugular venous distention. No thyroid enlargement, no tenderness.  LUNGS: Normal breath sounds bilaterally, no wheezing, rales,rhonchi or crepitation. No use of accessory muscles of respiration. Decreased bibasilar breath sounds CARDIOVASCULAR: S1, S2 normal. No murmurs, rubs, or gallops.  ABDOMEN: Soft, nontender, nondistended. Bowel sounds present. No organomegaly or mass.  EXTREMITIES: No pedal edema, cyanosis, or clubbing.  NEUROLOGIC: Follows simple commands. No focal motor or sensory deficits. Global weakness noted. PSYCHIATRIC: The patient is alert and oriented to self.  SKIN: No obvious rash, lesion, or ulcer.   DATA REVIEW:   CBC No results for input(s): WBC, HGB, HCT, PLT in the last 168 hours.  Chemistries   Recent Labs Lab  12/18/16 0425  CREATININE 0.70     Microbiology Results  Results for orders placed or performed during the hospital encounter of 12/14/16  Culture, blood (Routine x 2)     Status: None   Collection Time: 12/14/16 10:09 AM  Result Value Ref Range Status   Specimen Description BLOOD RIGHT [facility]  Final   Special Requests   Final    BOTTLES DRAWN AEROBIC AND ANAEROBIC ANA14ML AER14ML   Culture NO GROWTH 5 DAYS  Final   Report Status 12/19/2016 FINAL  Final  Urine culture     Status: Abnormal   Collection Time: 12/14/16 10:09 AM  Result Value Ref Range Status   Specimen Description URINE, RANDOM  Final   Special Requests NONE  Final   Culture >=100,000 COLONIES/mL ESCHERICHIA COLI (A)  Final   Report Status 12/16/2016 FINAL  Final   Organism ID, Bacteria ESCHERICHIA COLI (A)  Final      Susceptibility   Escherichia coli - MIC*    AMPICILLIN >=32 RESISTANT Resistant     CEFAZOLIN 8 SENSITIVE Sensitive     CEFTRIAXONE <=1 SENSITIVE Sensitive     CIPROFLOXACIN >=4 RESISTANT Resistant     GENTAMICIN <=1 SENSITIVE Sensitive     IMIPENEM <=0.25 SENSITIVE Sensitive     NITROFURANTOIN <=16 SENSITIVE Sensitive     TRIMETH/SULFA <=20 SENSITIVE Sensitive     AMPICILLIN/SULBACTAM 16 INTERMEDIATE Intermediate     PIP/TAZO <=4 SENSITIVE Sensitive     Extended ESBL NEGATIVE Sensitive     * >=100,000 COLONIES/mL ESCHERICHIA COLI  Culture, blood (Routine x 2)     Status: None   Collection Time: 12/14/16 10:10 AM  Result Value Ref Range Status   Specimen Description BLOOD RIGHT ASSIST CONTROL  Final   Special Requests   Final    BOTTLES DRAWN AEROBIC AND ANAEROBIC ANA10ML AER10ML   Culture NO GROWTH 5 DAYS  Final   Report Status 12/19/2016 FINAL  Final  MRSA PCR Screening     Status: None   Collection Time: 12/14/16  6:06 PM  Result Value Ref Range Status   MRSA by PCR NEGATIVE NEGATIVE Final    Comment:        The GeneXpert MRSA Assay (FDA approved for NASAL specimens only), is one  component of a comprehensive MRSA colonization surveillance program. It is not intended to diagnose MRSA infection nor to guide or monitor treatment for MRSA infections.     RADIOLOGY:  No results found.   Management plans discussed with the patient, family and they are in agreement.  CODE STATUS:  Code Status Orders        Start     Ordered   12/14/16 1514  Do not attempt resuscitation (DNR)  Continuous    Question Answer Comment  In the event of cardiac or respiratory ARREST Do not call a "code blue"   In the event of cardiac or respiratory ARREST Do not perform Intubation, CPR, defibrillation or ACLS   In the event of cardiac or respiratory ARREST Use medication by any route, position, wound care, and other measures to relive pain and suffering. May use oxygen, suction and manual treatment of airway obstruction as needed for comfort.      12/14/16 1513    Code Status History    Date Active Date Inactive Code Status Order ID Comments User Context   This patient has a current code status but no historical code status.    Advance Directive Documentation   Flowsheet Row Most Recent Value  Type of Advance Directive  Out of facility DNR (pink MOST or yellow form), Healthcare Power of Attorney  Pre-existing out of facility DNR order (yellow form or pink MOST form)  Yellow form placed in chart (order not valid for inpatient use)  "MOST" Form in Place?  No data      TOTAL TIME TAKING CARE OF THIS PATIENT: 37 minutes.    Enid Baas M.D on 12/23/2016 at 2:29 PM  Between 7am to 6pm - Pager - [phone]  After 6pm go to [email or web address] - Scientist, research (life sciences) [facility]  Office  [phone]  CC: Primary care physician; No PCP Per Patient   Note: This dictation was prepared with Dragon dictation along with smaller phrase technology. Any transcriptional errors that result from this process are unintentional.

## 2016-12-23 NOTE — Progress Notes (Signed)
Report called to [facility] LPN [facility] the [facility] [place] [phone]). Patient being transferred to room 103-A. Per Britta MccreedyBarbara, the patient's room is ready.   Non-emergent EMS transport called to transport patient @ 1558. Patient prepped for transport.

## 2016-12-23 NOTE — Clinical Social Work Placement (Signed)
   CLINICAL SOCIAL WORK PLACEMENT  NOTE  Date:  12/23/2016  Patient Details  Name: Paula Summers MRN: [id number] Date of Birth: [birth year]  Clinical Social Work is seeking post-discharge placement for this patient at the Skilled  Nursing Facility level of care (*CSW will initial, date and re-position this form in  chart as items are completed):  Yes   Patient/family provided with [facility] Social Work Department's list of facilities offering this level of care within the geographic area requested by the patient (or if unable, by the patient's family).  Yes   Patient/family informed of their freedom to choose among providers that offer the needed level of care, that participate in Medicare, Medicaid or managed care program needed by the patient, have an available bed and are willing to accept the patient.  Yes   Patient/family informed of [facility]'s ownership interest in [facility] and [facility], as well as of the fact that they are under no obligation to receive care at these facilities.  PASRR submitted to EDS on 12/16/16     PASRR number received on 12/16/16     Existing PASRR number confirmed on       FL2 transmitted to all facilities in geographic area requested by pt/family on 12/16/16     FL2 transmitted to all facilities within larger geographic area on       Patient informed that his/her managed care company has contracts with or will negotiate with certain facilities, including the following:        Yes   Patient/family informed of bed offers received.  Patient chooses bed at  [facility] [place], [place] )     Physician recommends and patient chooses bed at      Patient to be transferred to  [facility] [place] ) on 12/23/16.  Patient to be [facility] facility [facility]     Patient family notified on 12/23/16 of transfer.  Name of family member notified:   (Patient's daughter Liborio NixonJanice is aware of D/C  today. )     PHYSICIAN       Additional Comment:    _______________________________________________ Vannah Nadal, Darleen CrockerBailey M, LCSW 12/23/2016, 2:17 PM

## 2016-12-23 NOTE — Evaluation (Signed)
Physical Therapy Evaluation Patient Details Name: Paula Summers MRN: [id number] DOB: 13-Mar-1931 Today's Date: 12/23/2016   History of Present Illness  presented to ER secondary to progressive lethargy, AMS; admitted with acute hypoxic respiratory failure secondary to HCAP, sepsis/acute encephalopathy due to UTI.  Hopsital course significant for transition to comfort care (due to respiratory decline), but with improvement and change in goals of care.  PT re-consulted 12/24/15.  Clinical Impression  Patient no longer comfort care measures due to improvement in status; PT consulted for re-evaluation.  Patient remains globally weak and deconditioned, but demonstrates good efforts within abilities.  Able to complete bed mobility with mod/max assist; unsupported sitting balance with min assist; sit/stand with RW, mod assist +1.  Constant assist for forward weight shift and postural extension; persistent posterior trunk lean/weight shift in all sitting/standing postures with limited ability to self-correct.  Fatigues quickly; unable to tolerate additional mobility or gait efforts. Sats maintained >90% on 3L supplemental O2 via [place] throughout session. Would benefit from skilled PT to address above deficits and promote optimal return to PLOF; recommend transition to STR upon discharge from acute hospitalization.     Follow Up Recommendations SNF    Equipment Recommendations       Recommendations for Other Services       Precautions / Restrictions Precautions Precautions: Fall Precaution Comments: Aspiration Restrictions Weight Bearing Restrictions: No      Mobility  Bed Mobility Overal bed mobility: Needs Assistance Bed Mobility: Supine to Sit     Supine to sit: Mod assist;Max assist     General bed mobility comments: hand-over-hand for UE placement to assist with movement transition  Transfers Overall transfer level: Needs assistance Equipment used: Rolling walker (2  wheeled) Transfers: Sit to/from Stand Sit to Stand: Mod assist         General transfer comment: assist for lift off, anterior weight translation, standing balance  Ambulation/Gait             General Gait Details: unsafe to attempt due to poor standing balance  Stairs            Wheelchair Mobility    Modified Rankin (Stroke Patients Only)       Balance Overall balance assessment: Needs assistance Sitting-balance support: No upper extremity supported;Feet supported Sitting balance-Leahy Scale: Fair     Standing balance support: Bilateral upper extremity supported Standing balance-Leahy Scale: Poor                               Pertinent Vitals/Pain Pain Assessment: No/denies pain    Home Living Family/patient expects to be discharged to:: Group home                 Additional Comments: Per daughter, group home is single-level with ramp access    Prior Function Level of Independence: Needs assistance         Comments: Per daughter at bedside, at baseline, patient sup with RW for household distances; requiring assist for ADLs.  Intermittent R foot drag due to previous CVA with fatigue.     Hand Dominance        Extremity/Trunk Assessment   Upper Extremity Assessment Upper Extremity Assessment: Generalized weakness    Lower Extremity Assessment Lower Extremity Assessment: Generalized weakness (grossly 3-/5 throughout; globally weak and deconditioned)       Communication   Communication:  (limited spontaneous verbalization)  Cognition Arousal/Alertness: Awake/alert Behavior During Therapy: Flat  affect Overall Cognitive Status: History of cognitive impairments - at baseline                      General Comments      Exercises Other Exercises Other Exercises: Unsupported sitting edge of bed, min assist for sitting balance; standing with RW, mod assist for standing balance.  Assist for forward weight shifting  and postural extension in all positions.  Fatigues quickly.   Assessment/Plan    PT Assessment Patient needs continued PT services  PT Problem List Decreased strength;Decreased range of motion;Decreased activity tolerance;Decreased balance;Decreased mobility;Decreased coordination;Decreased cognition;Decreased safety awareness;Cardiopulmonary status limiting activity          PT Treatment Interventions DME instruction;Functional mobility training;Therapeutic activities;Therapeutic exercise;Balance training;Neuromuscular re-education;Patient/family education;Gait training    PT Goals (Current goals can be found in the Care Plan section)  Acute Rehab PT Goals Patient Stated Goal: per daughter, to try to go to rehab PT Goal Formulation: With patient/family Time For Goal Achievement: 01/06/17 Potential to Achieve Goals: Fair    Frequency Min 2X/week   Barriers to discharge        Co-evaluation               End of Session Equipment Utilized During Treatment: Gait belt;Oxygen Activity Tolerance: Patient tolerated treatment well Patient left: in bed;with call bell/phone within reach;with bed alarm set Nurse Communication: Mobility status         Time: [id number] PT Time Calculation (min) (ACUTE ONLY): 21 min   Charges:   PT Evaluation $PT Re-evaluation: 1 Procedure PT Treatments $Therapeutic Activity: 8-22 mins   PT G Codes:        [name], PT, DPT, NCS 12/23/16, 4:41 PM [phone]

## 2016-12-23 NOTE — Progress Notes (Signed)
[facility] from [facility]. Discharge summary faxed to referral. Thank you. Dayna BarkerKaren Robertson RN, BSN, [facility] [place], [facility] L[facility]ison [phone] c

## 2016-12-23 NOTE — Progress Notes (Addendum)
Per Dory PeruJoseph [place] liaison [employer] authorization was received on Friday for SNF but has expired on 12/22/16. Per Jomarie LongsJoseph he will call Monia Pouchetna to see they will approve patient for today.  Per Altamese CarolinaJoseph [employer] authorization is not good for today and they cannot accept patient today. Clinical Child psychotherapistocial Worker (CSW) contacted Chief Executive OfficerCSW director to discuss case. Per Chief Executive OfficerCSW director PT will have to work with patient again before we can determine her disposition. CSW asked MD to re-order PT. CSW made PT aware of above.   Baker Hughes IncorporatedBailey Cassondra Stachowski, LCSW [phone]

## 2016-12-23 NOTE — Progress Notes (Signed)
Daily Progress Note   Patient Name: Paula Summers       Date: 12/23/2016 DOB: [birth year]  Age: 83 y.o. MRN#: [id number] Attending Physician: Enid Baasadhika Kalisetti, MD Primary Care Physician: No PCP Per Patient Admit Date: 12/14/2016  Reason for Consultation/Follow-up: Establishing goals of care and Hospice Evaluation  Subjective: Ms. Mauricio PoMcCracken is lying in bed. Smiles and answers simple questions although otherwise confused. Denies pain.  Length of Stay: 9  Current Medications: Scheduled Meds:  . acetaminophen  1,000 mg Oral QHS  . divalproex  250 mg Oral BID  . mouth rinse  15 mL Mouth Rinse BID  . sodium chloride flush  3 mL Intravenous Q12H    Continuous Infusions:   PRN Meds: acetaminophen **OR** acetaminophen, benzonatate, bisacodyl, HYDROcodone-acetaminophen, ipratropium-albuterol, liver oil-zinc oxide, LORazepam, morphine injection, morphine CONCENTRATE, senna-docusate  Physical Exam  Constitutional: She appears well-developed.  HENT:  Head: Normocephalic and atraumatic.  Cardiovascular: Normal rate.   Pulmonary/Chest: Effort normal. No accessory muscle usage. No tachypnea. No respiratory distress.  Abdominal: Soft. Normal appearance.  Neurological: She is alert. She is disoriented.  Pleasant, smiling  Nursing note and vitals reviewed.           Vital Signs: BP 114/68 (BP Location: Left Arm)   Pulse 77   Temp 97.5 F (36.4 C) (Axillary)   Resp 16   Ht 5\' 1"  (1.549 m)   Wt 60.1 kg (132 lb 8 oz)   SpO2 97%   BMI 25.04 kg/m  SpO2: SpO2: 97 % O2 Device: O2 Device: Nasal Cannula O2 Flow Rate: O2 Flow Rate (L/min): 3 L/min  Intake/output summary: No intake or output data in the 24 hours ending 12/23/16 1118 LBM: Last BM Date: 12/22/16 Baseline Weight: Weight:  63.5 kg (140 lb) Most recent weight: Weight: 60.1 kg (132 lb 8 oz)       Palliative Assessment/Data:    Flowsheet Rows   Flowsheet Row Most Recent Value  Intake Tab  Referral Department  Hospitalist  Unit at Time of Referral  Oncology Unit  Palliative Care Primary Diagnosis  Cardiac  Date Notified  12/20/16  Palliative Care Type  New Palliative care  Reason for referral  Clarify Goals of Care, Counsel Regarding Hospice  Date of Admission  12/14/16  # of days IP prior to Palliative referral  6  Clinical Assessment  Palliative Performance Scale Score  30%  Psychosocial & Spiritual Assessment  Palliative Care Outcomes  Patient/Family meeting held?  Yes  Who was at the meeting?  patient and daughter Liborio Nixon)  Palliative Care Outcomes  Clarified goals of care, Provided end of life care assistance, Provided psychosocial or spiritual support, Changed to focus on comfort, Counseled regarding hospice, Improved pain interventions, Improved non-pain symptom therapy      Patient Active Problem List   Diagnosis Date Noted  . Alzheimer's dementia with behavioral disturbance   . Acute on chronic respiratory failure with hypoxia (HCC)   . Acute on chronic congestive heart failure (HCC)   . Palliative care by specialist   . Comfort measures only status   . Dyspnea   . Sepsis (HCC) 12/14/2016  . Recurrent UTI 02/17/2016  . A-fib (HCC) 02/16/2016  . Clinical depression 02/16/2016  . Dementia 02/16/2016  . Hypercholesteremia 02/16/2016  . Temporary cerebral vascular dysfunction 02/16/2016  . Excessive falling 02/16/2016  . Gait instability 02/16/2016  . Carotid stenosis   . Cholelithiasis without obstruction 05/14/2007  . Adult hypothyroidism 09/08/2006  . Allergic rhinitis 02/12/2006  . Colon, diverticulosis 02/12/2006  . Generalized OA 02/12/2006  . Glaucoma, compensated 02/12/2006  . BP (high blood pressure) 02/12/2006    Palliative Care Assessment & Plan   HPI: 83 y.o.  female  with past medical history of dementia, hypertension, hyperlipidemia, depression, CHF, carotid stenosis, and afib admitted on 12/14/2016 with altered mental status from [facility]. Seen by neurologist prior to admit where urine was found positive for UTI-on cipro. At group home, patient with increased lethargy and chills. In ED, chest xray revealed possible pneumonia, positive for UTI, and septic with hypotension. Acute hypoxic respiratory failure along with acute on chronic CHF with EF 45-50%. Patient has been on venti-mask and unable to wean without desaturation. Palliative medicine consultation for goals of care.   Assessment: I spoke with Ms. Dahlen's daughter, Liborio Nixon. There have been many options discussed regarding discharge including hospice facility and hospice at her group home. She is eating more than anticipated but inconsistently. Liborio Nixon has good understanding that her mother may decline quickly at any point in time. She also has concerns regarding the group home's ability to care for her mother (she is unable to even lift her legs slightly off the bed, incontinent). Liborio Nixon would like to pursue short term rehab with palliative to follow. Liborio Nixon confirms that she would NOT want her mother readmitted but would opt for comfort care with further decline. We plan to meet today at noon to complete MOST form.  Update: Family not at bedside after checking at noon and again at 4pm - RN reports they have not seen family at bedside.   Recommendations/Plan: Recommend outpatient palliative services and completion of MOST form.   Comfort care if declines.  Likely will need transition from SNF to hospice facility with further decline at EOL.   Goals of Care and Additional Recommendations:  Limitations on Scope of Treatment: Full Comfort Care  Code Status:  DNR  Prognosis:   < 6 weeks  Discharge Planning:  Skilled Nursing Facility for rehab with Palliative care service  follow-up   Thank you for allowing the Palliative Medicine Team to assist in the care of this patient.   Total Time: Prolonged Time Billed  no       Greater than 50%  of this time was spent counseling and coordinating care related to the  above assessment and plan.  Yong Channel, NP Palliative Medicine Team Pager # [phone] (M-F 8a-5p) Team Phone # [phone] (Nights/Weekends)

## 2016-12-25 DIAGNOSIS — Z515 Encounter for palliative care: Secondary | ICD-10-CM

## 2016-12-25 DIAGNOSIS — R1310 Dysphagia, unspecified: Secondary | ICD-10-CM

## 2017-01-04 ENCOUNTER — Encounter: Payer: Self-pay | Admitting: Family Medicine

## 2017-01-08 ENCOUNTER — Encounter: Payer: Self-pay | Admitting: Family Medicine

## 2017-01-17 ENCOUNTER — Encounter: Payer: Self-pay | Admitting: General Practice

## 2017-05-09 DEATH — deceased
